# Patient Record
Sex: Female | Born: 1935 | Race: White | Hispanic: No | Smoking: Never smoker
Health system: Southern US, Community
[De-identification: ages and names within clinical notes are randomized; demographics above are authoritative.]

## PROBLEM LIST (undated history)

## (undated) DIAGNOSIS — M1990 Unspecified osteoarthritis, unspecified site: Secondary | ICD-10-CM

## (undated) DIAGNOSIS — I639 Cerebral infarction, unspecified: Secondary | ICD-10-CM

## (undated) DIAGNOSIS — K219 Gastro-esophageal reflux disease without esophagitis: Secondary | ICD-10-CM

## (undated) DIAGNOSIS — T4145XA Adverse effect of unspecified anesthetic, initial encounter: Secondary | ICD-10-CM

## (undated) DIAGNOSIS — I480 Paroxysmal atrial fibrillation: Secondary | ICD-10-CM

## (undated) DIAGNOSIS — R011 Cardiac murmur, unspecified: Secondary | ICD-10-CM

## (undated) DIAGNOSIS — I10 Essential (primary) hypertension: Secondary | ICD-10-CM

## (undated) DIAGNOSIS — C801 Malignant (primary) neoplasm, unspecified: Secondary | ICD-10-CM

## (undated) DIAGNOSIS — T8859XA Other complications of anesthesia, initial encounter: Secondary | ICD-10-CM

## (undated) HISTORY — PX: CHOLECYSTECTOMY: SHX55

## (undated) HISTORY — PX: OTHER SURGICAL HISTORY: SHX169

---

## 1998-10-02 ENCOUNTER — Other Ambulatory Visit: Admission: RE | Admit: 1998-10-02 | Discharge: 1998-10-02 | Payer: Self-pay | Admitting: *Deleted

## 2001-09-03 ENCOUNTER — Encounter: Payer: Self-pay | Admitting: Emergency Medicine

## 2001-09-03 ENCOUNTER — Emergency Department (HOSPITAL_COMMUNITY): Admission: EM | Admit: 2001-09-03 | Discharge: 2001-09-03 | Payer: Self-pay | Admitting: Emergency Medicine

## 2001-09-11 ENCOUNTER — Ambulatory Visit (HOSPITAL_COMMUNITY): Admission: RE | Admit: 2001-09-11 | Discharge: 2001-09-11 | Payer: Self-pay | Admitting: Family Medicine

## 2003-11-22 ENCOUNTER — Encounter: Admission: RE | Admit: 2003-11-22 | Discharge: 2003-11-22 | Payer: Self-pay | Admitting: Neurosurgery

## 2003-12-09 ENCOUNTER — Encounter: Admission: RE | Admit: 2003-12-09 | Discharge: 2003-12-09 | Payer: Self-pay | Admitting: Neurosurgery

## 2004-05-11 ENCOUNTER — Encounter: Admission: RE | Admit: 2004-05-11 | Discharge: 2004-05-11 | Payer: Self-pay | Admitting: Neurosurgery

## 2004-06-01 ENCOUNTER — Encounter: Admission: RE | Admit: 2004-06-01 | Discharge: 2004-06-01 | Payer: Self-pay | Admitting: Neurosurgery

## 2004-09-20 ENCOUNTER — Encounter: Admission: RE | Admit: 2004-09-20 | Discharge: 2004-09-20 | Payer: Self-pay | Admitting: Neurosurgery

## 2004-10-12 ENCOUNTER — Encounter (INDEPENDENT_AMBULATORY_CARE_PROVIDER_SITE_OTHER): Payer: Self-pay | Admitting: *Deleted

## 2004-10-12 ENCOUNTER — Encounter: Admission: RE | Admit: 2004-10-12 | Discharge: 2004-10-12 | Payer: Self-pay | Admitting: Family Medicine

## 2004-10-20 ENCOUNTER — Other Ambulatory Visit: Admission: RE | Admit: 2004-10-20 | Discharge: 2004-10-20 | Payer: Self-pay | Admitting: Family Medicine

## 2007-12-30 ENCOUNTER — Encounter: Admission: RE | Admit: 2007-12-30 | Discharge: 2007-12-30 | Payer: Self-pay | Admitting: Specialist

## 2008-04-21 ENCOUNTER — Encounter: Admission: RE | Admit: 2008-04-21 | Discharge: 2008-04-21 | Payer: Self-pay | Admitting: Specialist

## 2008-09-10 ENCOUNTER — Inpatient Hospital Stay (HOSPITAL_COMMUNITY): Admission: EM | Admit: 2008-09-10 | Discharge: 2008-09-12 | Payer: Self-pay | Admitting: Orthopedic Surgery

## 2008-11-08 ENCOUNTER — Other Ambulatory Visit: Admission: RE | Admit: 2008-11-08 | Discharge: 2008-11-08 | Payer: Self-pay | Admitting: Family Medicine

## 2009-01-12 ENCOUNTER — Ambulatory Visit: Payer: Self-pay | Admitting: Vascular Surgery

## 2010-09-02 ENCOUNTER — Encounter: Payer: Self-pay | Admitting: Neurosurgery

## 2010-11-27 LAB — CBC
HCT: 34.8 % — ABNORMAL LOW (ref 36.0–46.0)
HCT: 43.5 % (ref 36.0–46.0)
Hemoglobin: 11.7 g/dL — ABNORMAL LOW (ref 12.0–15.0)
Hemoglobin: 14.6 g/dL (ref 12.0–15.0)
MCV: 84.9 fL (ref 78.0–100.0)
RDW: 13.9 % (ref 11.5–15.5)
RDW: 13.9 % (ref 11.5–15.5)

## 2010-11-27 LAB — BASIC METABOLIC PANEL
BUN: 14 mg/dL (ref 6–23)
BUN: 16 mg/dL (ref 6–23)
CO2: 28 mEq/L (ref 19–32)
Calcium: 9.9 mg/dL (ref 8.4–10.5)
Chloride: 102 mEq/L (ref 96–112)
Chloride: 104 mEq/L (ref 96–112)
Creatinine, Ser: 0.79 mg/dL (ref 0.4–1.2)
GFR calc Af Amer: >60 mL/min (ref >60)
GFR calc Af Amer: >60 mL/min (ref >60)
GFR calc non Af Amer: >60 mL/min (ref >60)
Glucose, Bld: 113 mg/dL — ABNORMAL HIGH (ref 70–99)

## 2010-11-27 LAB — URINALYSIS, ROUTINE W REFLEX MICROSCOPIC
Bilirubin Urine: NEGATIVE
Hgb urine dipstick: NEGATIVE
Ketones, ur: NEGATIVE mg/dL
Protein, ur: NEGATIVE mg/dL
Urobilinogen, UA: 0.2 mg/dL (ref 0.0–1.0)

## 2010-11-27 LAB — DIFFERENTIAL
Lymphocytes Relative: 17 % (ref 12–46)
Lymphs Abs: 1.3 10*3/uL (ref 0.7–4.0)
Monocytes Absolute: 0.5 10*3/uL (ref 0.1–1.0)
Monocytes Relative: 6 % (ref 3–12)
Neutro Abs: 6.1 10*3/uL (ref 1.7–7.7)

## 2010-11-27 LAB — TYPE AND SCREEN: ABO/RH(D): O POS

## 2010-11-27 LAB — ABO/RH: ABO/RH(D): O POS

## 2010-12-26 NOTE — Op Note (Signed)
[name], [name]                 ACCOUNT NO.:  [id number]   MEDICAL RECORD NO.:  [id number]          PATIENT TYPE:  INP   LOCATION:  2899                         FACILITY:  [facility]   PHYSICIAN:  [name], M.D. DATE OF BIRTH:  10/02/1932   DATE OF PROCEDURE:  09/10/2008  DATE OF DISCHARGE:                               OPERATIVE REPORT   PREOPERATIVE DIAGNOSIS:  Left shoulder end-stage osteoarthritis.   POSTOPERATIVE DIAGNOSIS:  Left shoulder end-stage osteoarthritis.   PROCEDURE PERFORMED:  Left total shoulder arthroplasty using DePuy  Global system.   ATTENDING SURGEON:  Kristin Balls. Ranell Patrick, MD.   ASSISTANT:  [name]. [name]   ANESTHESIA:  General anesthesia was regional plus interscalene block.   ESTIMATED BLOOD LOSS:  Less than 50 mL.   FLUID REPLACEMENT:  2100 mL of crystalloid.   URINE OUTPUT:  600 mL.   INSTRUMENT COUNT:  Correct.   COMPLICATIONS:  There are no complications.   Perioperative antibiotics were given.   INDICATIONS:  The patient is a 77 year old female with disabling left  shoulder pain secondary to end-stage arthritis.  The patient has failed  conservative management consisting of injection therapy and  antiinflammatories, presents now for operative treatment on her shoulder  to restore function and eliminate pain.  Informed consent was obtained.   DESCRIPTION OF PROCEDURE:  After an adequate level of anesthesia was  achieved, the patient was positioned in a modified beach-chair position.  The left shoulder was sterilely prepped and draped in the usual manner.  The patient's preoperative range of motion showed excellent motion with  forward elevation of 280 degrees, abduction 120, external rotations 60,  internal rotation posterior belt line.  Following a sterile prep and  drape, we entered the shoulder using standard deltopectoral approach  starting coracoid process, incision made down to the anterior humeral  shaft.  Dissection down  through the subcutaneous tissues using Bovie,  identified the cephalic vein, took it laterally to the deltoid and  pectoralis was taken medially.  Conjoint tendon identified and retracted  that away.  Subscapularis was in good condition.  We performed a release  through subscapularis directly of the medial side of the biceps screw  and then we achieved that, going over towards rotator interval and just  to the base of coracoid process.  We freed up the subscapularis,  placed  #2 FiberWire sutures in modified Mason-Allen suture technique in the  free end of the tendon.  We next went ahead and released the soft tissue  of the proximal humerus progressively externally rotating until the  capsule is completely released off so we can deliver the shoulder  rotation.  We marked her neck cut using a neck cut guide and a T-handle  Crego elevator for retraction.  We then cut the head off using an  oscillating saw with appropriate version.  We basically had the  patient's elbow at the side and externally rotated the elbow about 10  degrees for resection.  At this point, I am happy with her resection,  removed excess osteophytes of proximal humerus, we reamed  up to a size  10 with a hand reamer trying to broach I could get to 8 down well to 10.  I could get down within about 2 mm or so of the osteotomy site, but was  not happy, was not being able to get it fully down.  I was afraid  because of the osteoporosis that if we get it any hard it may break the  humerus.  Thus, we decided to cement in an aid.  I took the aid body and  put it down in the prepared humerus and then looked at a coverage of a  40 head and this looked entirely appropriate with 40 x 15.  We next went  ahead and removed the head.  We then retracted the humerus posteriorly,  removed the labrum off the glenoid, scraped the cartilage off the  glenoid, this appeared to be size 40 glenoid marked to center point and  drilled out with the  centering drill bit.  We then reamed using a #40  reamer until we could get some bleeding bone, there were bunch of cysts  that were encountered.  We used small curettes to remove all cystic  material, which involved a large portion of the face of the glenoid.  It  is still felt to be contained lesions everywhere and this should be  amenable to cementing a keeled glenoid.  Unfortunately, we were worried  about the anchor peg being a little too big and getting near the edges  of the glenoid face.  So, we are going to keel glenoid and we drilled  out 12 o'clock and 6 o'clock holes.  Removed all excess soft tissue.  We  then went ahead and punched it with a keel punch and then placed a trial  in place, which looked like it had good coverage and rested down nicely  on the reamed glenoid surface.  We then reduced the shoulder with a 40 x  15 head and this looked appropriate with a soft tissue balancing,  removed the trial components, cemented the real 8 stem in after we  placed #2 FiberWires into the lesser tuberosity which cemented the 8  stem in place.  While the cement hardened, we removed the excess cement  with quarter-inch curved osteotome and then retracted the humerus  posteriorly.  We placed Gelfoam-soaked thrombin in the glenoid and then  dried that nicely and then using a Tumi syringe, injected the cement  into the glenoid and then cemented the keeled glenoid in place, this is  a DePuy all-poly keeled glenoid.  Once that was allowed to harden, we  removed excess cement with quarter-inch curved osteotome, trialed with  an 18 first and the 18 actually gave [place] good soft tissue balancing, able  to translate 50% posteriorly and 30% inferiorly.  We then selected the  real 40 x 18 humeral head and then packed that on paper, checked for any  debris before reducing the shoulder.  We were happy with the stability  and then repaired the subscapularis seen anatomically using figure-of-  eight  sutures into the rotator interval with #2 FiberWire using the  shaft sutures and the Mason-Allen sutures to secure the subscapularis  anatomically.  We were happy with that repair and able to externally  rotate 60 degrees following the repair with the nice stable shoulder.  We thoroughly irrigated and closed the deltopectoral wound with 0 Vicryl  suture followed by 2-0 Vicryl subcutaneous closure and 4-0 Monocryl for  skin.  Steri-Strips applied followed by a sterile dressing.  The patient  tolerated the surgery well.      [name], M.D.  Electronically Signed     SRN/MEDQ  D:  09/10/2008  T:  09/11/2008  Job:  [id number]

## 2010-12-26 NOTE — Consult Note (Signed)
NEW PATIENT CONSULTATION   [name], [name]  DOB:  1933/08/01                                       01/12/2009  CHART#:[id number]   The patient presents today for evaluation of lower extremity discomfort.  She is a very pleasant 77 year old white female who reports pain,  burning and stinging sensation in both legs.  She reports this is  somewhat worse on her left leg than right and reports this is mainly  from her knees distally.  She reports, however, this can occur any time  and can occur at night with rest as well.  She has mild swelling in her  ankles and relates this to trivial trauma in her left ankle many years  ago.  She denies any prior history of deep venous thrombosis.   She does have history hypertension, osteoarthritis, actinic keratosis  and chronic low back pain.  She has had recent shoulder replacement in  January, 2010.  She does elevate her legs when possible and feels this  does make them feel somewhat better.  She does wear nonprescription  support hose and feels that she does get some relief with this as well.  She has tried prescription grade compression garments in the past and  reports these are difficult to wear as she feels they are  binding her  legs.   PHYSICAL EXAMINATION:  A well-developed, well-nourished white female  appearing stated age of 60.  Blood pressure 142/92, pulse 74,  respirations 18.  Her radial pulses are 2+.  She has 2+ dorsalis pedis  pulses.  She does not have any evidence of varicose veins and does not  have any swelling.  She has had multiple bilateral scattered  telangiectasia.   She underwent screening venous duplex by myself and this shows normal  size saphenous veins bilaterally with no evidence of valvular  incompetence.   I discussed this at length with the patient.  I explained that she does  not appear to have any evidence of any serious significant for reflux or  other venous pathology aside from  spider vein telangiectasia.  I have  encouraged her to continue her nonprescription grade support hose for  comfort.  I would not recommend prescription grade since that this be  difficult to her wear with her shoulder replacement and I do not feel  that she has any pathology that would warrant this degree of  compression.  She was reassured with this discussion and will see [place] on  an as-needed basis.   Larina Earthly, M.D.  Electronically Signed   TFE/MEDQ  D:  01/12/2009  T:  01/13/2009  Job:  [id number]   cc:   Katrina Stack, FNP

## 2010-12-29 NOTE — H&P (Signed)
NAMESACHE, SANE                 ACCOUNT NO.:  [id number]   MEDICAL RECORD NO.:  [id number]           PATIENT TYPE:   LOCATION:                                 FACILITY:   PHYSICIAN:  Almedia Balls. Ranell Patrick, M.D. DATE OF BIRTH:  [birth year]   DATE OF ADMISSION:  DATE OF DISCHARGE:                              HISTORY & PHYSICAL   CHIEF COMPLAINT:  Left shoulder pain.   HISTORY OF PRESENT ILLNESS:  The patient is a 77 year old female with  worsening left shoulder pain, it has been refractory to conservative  treatment.  The patient elected to have left total shoulder arthroplasty  by Dr. Malon Kindle.   PAST MEDICAL HISTORY:  Hypertension.   FAMILY MEDICAL HISTORY:  Cancer and CVA.   SOCIAL HISTORY:  The patient of Dr.  Foy Guadal[place], does not smoke or use  alcohol, and is retired.   DRUG ALLERGIES:  None.   CURRENT MEDICATIONS:  1. Mobic 50 mg p.o. daily.  2. Omeprazole 1 tablet daily.  3. Hyzaar 100/25 daily.  4. Ambien 10 mg p.o. daily.  5. Vitamins and calcium.   REVIEW OF SYSTEMS:  Pain with range of motion.   PHYSICAL EXAMINATION:  VITAL SIGNS:  Pulse 80, respirations 16, and  blood pressure 146/88.  GENERAL:  The patient is a healthy-appearing 77 year old female in no  acute distress.  Pleasant mood and affect.  Oriented x3.  HEAD AND NECK:  Full range of motion without any difficulty.  Cranial  nerves II through XII grossly intact.  CHEST:  Active breath sounds bilaterally with no wheeze, rhonchi, or  rales.  ABDOMEN:  Nontender, nondistended with active bowel sounds.  EXTREMITIES:  Moderate tenderness over the left shoulder, forward  flexion 80 degrees, external rotation 10 degrees, internal rotation  __________.  Capillary refill less than 2 seconds.  She has no rashes or  edema.  X-rays showing end-stage osteoarthritis of the left shoulder.   IMPRESSION:  End-stage osteoarthritis, left shoulder.   PLAN OF ACTION:  A left total shoulder arthroplasty by Dr. Malon Kindle.      Thomas B. Durwin Nora, P.A.      Almedia Balls. Ranell Patrick, M.D.  Electronically Signed    TBD/MEDQ  D:  08/24/2008  T:  08/25/2008  Job:  [id number]

## 2010-12-29 NOTE — Discharge Summary (Signed)
[name], [name]                 ACCOUNT NO.:  [id number]   MEDICAL RECORD NO.:  [id number]          PATIENT TYPE:  INP   LOCATION:  5018                         FACILITY:  [facility]   PHYSICIAN:  Almedia Balls. Ranell Patrick, M.D. DATE OF BIRTH:  07/17/1933   DATE OF ADMISSION:  09/10/2008  DATE OF DISCHARGE:  09/12/2008                               DISCHARGE SUMMARY   ADMISSION DIAGNOSIS:  Left end-stage shoulder osteoarthritis.   DISCHARGE DIAGNOSIS:  Left end-stage shoulder osteoarthritis status post  total shoulder arthroplasty.   BRIEF HISTORY:  The patient is a 77 year old female with worsening left  shoulder pain secondary to osteoarthritis.  The patient has elected to  have a total shoulder replacement.   PROCEDURE:  The patient had left total shoulder arthroplasty by Dr.  Malon Kindle on February 08, 2009.   SURGEON:  Almedia Balls. Ranell Patrick, MD   ASSISTANT:  Donnie Coffin. Dixon, PA   General anesthesia with interscalene block were used.   No complications.   HOSPITAL COURSE:  The patient was admitted on September 10, 2008, for the  above-stated procedure which she tolerated well.  After adequate time in  postanesthesia care unit, she was transferred up to 5000.  On postop day  1, the patient complained about minimal pain to that shoulder.  She was  able to work with physical therapy quite well that day.  Denied any  other symptoms.  No chills and otherwise is doing quite well.  On postop  day 2, she was discharged from the hospital secondary to doing so well  with therapy.  Sling was in place and dressing was intact, and the labs  were within acceptable limits.   DISCHARGE MEDICATIONS:  1. Mobic 15 mg daily.  2. Omeprazole 1 tablet daily.  3. Hyzaar 100/25 daily.  4. Ambien 10 mg p.o. nightly.  5. Percocet 5/325 one to two tabs q.4-6 hours p.r.n. pain.  6. Robaxin 500 mg p.o. q.6 hours.   FOLLOWUP:  Followup back with Dr. Malon Kindle in 2 weeks.  Her  condition is stable.  Her diet  is regular.  The patient has no known  drug allergies.      Thomas B. Durwin Nora, P.A.      Almedia Balls. Ranell Patrick, M.D.  Electronically Signed    TBD/MEDQ  D:  10/22/2008  T:  10/23/2008  Job:  [id number]

## 2012-01-10 ENCOUNTER — Encounter (HOSPITAL_COMMUNITY): Payer: Self-pay | Admitting: Pharmacist

## 2012-01-15 ENCOUNTER — Encounter (HOSPITAL_COMMUNITY)
Admission: RE | Admit: 2012-01-15 | Discharge: 2012-01-15 | Disposition: A | Discharge status: Home / Self Care | Payer: Medicare Other | Source: Ambulatory Visit | Attending: Orthopedic Surgery | Admitting: Orthopedic Surgery

## 2012-01-15 ENCOUNTER — Ambulatory Visit (HOSPITAL_COMMUNITY)
Admission: RE | Admit: 2012-01-15 | Discharge: 2012-01-15 | Disposition: A | Discharge status: Home / Self Care | Payer: Medicare Other | Source: Ambulatory Visit | Attending: Orthopedic Surgery | Admitting: Orthopedic Surgery

## 2012-01-15 ENCOUNTER — Encounter (HOSPITAL_COMMUNITY): Payer: Self-pay

## 2012-01-15 DIAGNOSIS — Z01818 Encounter for other preprocedural examination: Secondary | ICD-10-CM | POA: Insufficient documentation

## 2012-01-15 DIAGNOSIS — Z01810 Encounter for preprocedural cardiovascular examination: Secondary | ICD-10-CM | POA: Insufficient documentation

## 2012-01-15 DIAGNOSIS — Z01812 Encounter for preprocedural laboratory examination: Secondary | ICD-10-CM | POA: Insufficient documentation

## 2012-01-15 DIAGNOSIS — I498 Other specified cardiac arrhythmias: Secondary | ICD-10-CM | POA: Insufficient documentation

## 2012-01-15 HISTORY — DX: Cardiac murmur, unspecified: R01.1

## 2012-01-15 HISTORY — DX: Essential (primary) hypertension: I10

## 2012-01-15 HISTORY — DX: Unspecified osteoarthritis, unspecified site: M19.90

## 2012-01-15 HISTORY — DX: Gastro-esophageal reflux disease without esophagitis: K21.9

## 2012-01-15 LAB — SURGICAL PCR SCREEN: Staphylococcus aureus: NEGATIVE

## 2012-01-15 LAB — URINALYSIS, ROUTINE W REFLEX MICROSCOPIC
Bilirubin Urine: NEGATIVE
Nitrite: NEGATIVE
Specific Gravity, Urine: 1.022 (ref 1.005–1.030)
Urobilinogen, UA: 0.2 mg/dL (ref 0.0–1.0)
pH: 7 (ref 5.0–8.0)

## 2012-01-15 LAB — DIFFERENTIAL
Basophils Absolute: 0 10*3/uL (ref 0.0–0.1)
Basophils Relative: 0 % (ref 0–1)
Eosinophils Relative: 2 % (ref 0–5)
Monocytes Absolute: 0.7 10*3/uL (ref 0.1–1.0)
Neutro Abs: 5.9 10*3/uL (ref 1.7–7.7)

## 2012-01-15 LAB — CBC
HCT: 40 % (ref 36.0–46.0)
MCHC: 32.5 g/dL (ref 30.0–36.0)
MCV: 86.6 fL (ref 78.0–100.0)
Platelets: 218 10*3/uL (ref 150–400)
RDW: 13.8 % (ref 11.5–15.5)
WBC: 8.5 10*3/uL (ref 4.0–10.5)

## 2012-01-15 LAB — BASIC METABOLIC PANEL
BUN: 22 mg/dL (ref 6–23)
Calcium: 10.6 mg/dL — ABNORMAL HIGH (ref 8.4–10.5)
Chloride: 100 mEq/L (ref 96–112)
Creatinine, Ser: 0.76 mg/dL (ref 0.50–1.10)
GFR calc Af Amer: >90 mL/min (ref >90)

## 2012-01-15 LAB — APTT: aPTT: 32 seconds (ref 24–37)

## 2012-01-15 LAB — URINE MICROSCOPIC-ADD ON

## 2012-01-15 LAB — PROTIME-INR: Prothrombin Time: 13 seconds (ref 11.6–15.2)

## 2012-01-15 MED ORDER — CEFAZOLIN SODIUM 1-5 GM-% IV SOLN: 1.0000 g | INTRAVENOUS | Status: DC

## 2012-01-15 MED ORDER — CHLORHEXIDINE GLUCONATE 4 % EX LIQD: 60.0000 mL | Freq: Once | CUTANEOUS | Status: DC

## 2012-01-15 NOTE — Patient Instructions (Addendum)
20 [name]  01/15/2012   Your procedure is scheduled on:  01-21-2012  Report to [facility] at   [phone].  Call this number if you have problems the morning of surgery: [phone]   Remember:   Do not eat food or drink liquids:After Midnight.  .  Take these medicines the morning of surgery with A SIP OF WATER: omeprazole, cymbalta   Do not wear jewelry or make up.  Do not wear lotions, powders, or perfumes.Do not wear deodorant.    Do not bring valuables to the hospital.  Contacts, dentures or bridgework may not be worn into surgery.  Leave suitcase in the car. After surgery it may be brought to your room.  For patients admitted to the hospital, checkout time is 11:00 AM the day of discharge.     Special Instructions: CHG Shower Use Special Wash: 1/2 bottle night before surgery and 1/2 bottle morning of surgery.neck down avoid private area   Please read over the following fact sheets that you were given: MRSA Information, blood fact sheet, incentive spirometer fact sheet  [facility] pre op nurse phone number [phone], call if needed

## 2012-01-15 NOTE — Pre-Procedure Instructions (Signed)
bmet results faxed to dr Sherlynn Carbon, fax confirmation received and placed on chart

## 2012-01-16 NOTE — H&P (Signed)
[name] is an 78 y.o. female.    Chief Complaint: left knee OA and pain   HPI: Pt is a 78 y.o. female complaining of left knee pain for 3+ years. Pain had continually increased since the beginning. X-rays in the clinic show end-stage arthritic changes of the left knee. Pt has tried various conservative treatments which have failed to alleviate their symptoms, including NSAIDs and steroid injections. Various options are discussed with the patient. Risks, benefits and expectations were discussed with the patient. Patient understand the risks, benefits and expectations and wishes to proceed with surgery.   PCP:  Lenora Boys, MD, MD  D/C Plans:  Rehab - would prefer [facility]  Post-op Meds:  No Rx given  Tranexamic Acid:   To be given  Decadron:   To be given  PMH: Past Medical History  Diagnosis Date  . Hypertension   . Heart murmur - mild, no cardiologist   . GERD (gastroesophageal reflux disease)   . Arthritis     PSH: Past Surgical History  Procedure Date  . Pre skin cancer areas removed from left middle finger and right  hand and right leg yrs ago  . Cesarean section 1957  . Cholecystectomy 1963 or 1964  . Tear duct surgery both eyes 18-20 yrs ago  . Left shoulder replacement 3-4- yrs ago  . Both eyes lens replacements for cataracts dec 2012     Social History:  reports that she has never smoked. She has never used smokeless tobacco. She reports that she does not drink alcohol or use illicit drugs.  Allergies:  No Known Allergies  Medications: Current Outpatient Prescriptions  Medication Sig Dispense Refill  . calcium-vitamin D (OSCAL WITH D) 500-200 MG-UNIT per tablet Take 1 tablet by mouth daily.      . DULoxetine (CYMBALTA) 60 MG capsule Take 60 mg by mouth 2 (two) times daily.      Marland Kitchen losartan-hydrochlorothiazide (HYZAAR) 100-25 MG per tablet Take 1 tablet by mouth daily.      . meloxicam (MOBIC) 15 MG tablet Take 15 mg by mouth daily.      .  Multiple Vitamin (MULITIVITAMIN WITH MINERALS) TABS Take 1 tablet by mouth daily.      Marland Kitchen omeprazole (PRILOSEC) 20 MG capsule Take 20 mg by mouth every morning.       . Vitamin D, Ergocalciferol, (DRISDOL) 50000 UNITS CAPS Take 50,000 Units by mouth every 7 (seven) days. Takes on Tuesdays.      Marland Kitchen zolpidem (AMBIEN) 10 MG tablet Take 10 mg by mouth at bedtime as needed. For insomnia.        ROS: Review of Systems  Constitutional: Negative.   HENT: Negative.   Eyes: Negative.   Respiratory: Negative.   Cardiovascular: Negative.   Gastrointestinal: Negative.   Genitourinary: Positive for frequency.  Musculoskeletal: Positive for myalgias, back pain and joint pain.  Skin: Negative.   Neurological: Negative.   Endo/Heme/Allergies: Negative.   Psychiatric/Behavioral: Negative.      Physical Exam: There were no vitals taken for this visit. Physical Exam  Constitutional: She is oriented to person, place, and time and well-developed, well-nourished, and in no distress.  HENT:  Head: Normocephalic and atraumatic.  Nose: Nose normal.  Mouth/Throat: Oropharynx is clear and moist. She has dentures.  Eyes: Pupils are equal, round, and reactive to light.  Neck: Neck supple. No JVD present. No tracheal deviation present. No thyromegaly present.  Cardiovascular: Normal rate, regular rhythm and intact  distal pulses.   Murmur heard. Pulmonary/Chest: Effort normal and breath sounds normal. No respiratory distress. She exhibits no tenderness.  Abdominal: Soft. There is no tenderness. There is no guarding.  Musculoskeletal:       Left knee: She exhibits decreased range of motion, swelling and bony tenderness. She exhibits no effusion, no ecchymosis, no laceration and no erythema. tenderness found.  Lymphadenopathy:    She has no cervical adenopathy.  Neurological: She is alert and oriented to person, place, and time.  Skin: Skin is warm and dry.  Psychiatric: Affect normal.       Assessment/Plan Assessment: left knee OA and pain   Plan: Patient will undergo a left total knee arthroplasty on 01/21/2012 per Dr. Charlann Boxer at [facility]. Risks benefits and expectation were discussed with the patient. Patient understand risks, benefits and expectation and wishes to proceed.   Anastasio Auerbach Teyonna Plaisted   PAC  01/16/2012, 3:20 PM

## 2012-01-17 NOTE — Pre-Procedure Instructions (Signed)
Showed dr Acey Lav 01-15-2012 ekg and 09-03-2001 ekg and 09-10-2008 ekg results, made aware of patient medical history, pt ok for surgery.

## 2012-01-21 ENCOUNTER — Encounter (HOSPITAL_COMMUNITY): Payer: Self-pay | Admitting: Anesthesiology

## 2012-01-21 ENCOUNTER — Encounter (HOSPITAL_COMMUNITY): Payer: Self-pay | Admitting: Orthopedic Surgery

## 2012-01-21 ENCOUNTER — Ambulatory Visit (HOSPITAL_COMMUNITY): Payer: Medicare Other | Admitting: Anesthesiology

## 2012-01-21 ENCOUNTER — Inpatient Hospital Stay (HOSPITAL_COMMUNITY)
Admission: RE | Admit: 2012-01-21 | Discharge: 2012-01-24 | DRG: 470 | Disposition: A | Discharge status: Skilled Nursing Facility | Payer: Medicare Other | Source: Ambulatory Visit | Attending: Orthopedic Surgery | Admitting: Orthopedic Surgery

## 2012-01-21 ENCOUNTER — Encounter (HOSPITAL_COMMUNITY)
Admission: RE | Disposition: A | Discharge status: Skilled Nursing Facility | Payer: Self-pay | Source: Ambulatory Visit | Attending: Orthopedic Surgery

## 2012-01-21 ENCOUNTER — Encounter (HOSPITAL_COMMUNITY): Payer: Self-pay | Admitting: *Deleted

## 2012-01-21 DIAGNOSIS — Z96659 Presence of unspecified artificial knee joint: Secondary | ICD-10-CM

## 2012-01-21 DIAGNOSIS — K219 Gastro-esophageal reflux disease without esophagitis: Secondary | ICD-10-CM | POA: Diagnosis present

## 2012-01-21 DIAGNOSIS — I10 Essential (primary) hypertension: Secondary | ICD-10-CM | POA: Diagnosis present

## 2012-01-21 DIAGNOSIS — M1710 Unilateral primary osteoarthritis, unspecified knee: Principal | ICD-10-CM | POA: Diagnosis present

## 2012-01-21 HISTORY — PX: TOTAL KNEE ARTHROPLASTY: SHX125

## 2012-01-21 SURGERY — ARTHROPLASTY, KNEE, TOTAL
Anesthesia: Spinal | Site: Knee | Laterality: Left | Wound class: Clean

## 2012-01-21 MED ORDER — DOCUSATE SODIUM 100 MG PO CAPS
100.0000 mg | ORAL_CAPSULE | Freq: Two times a day (BID) | ORAL | Status: DC
  Administered 2012-01-21 – 2012-01-23 (×5): 100 mg via ORAL

## 2012-01-21 MED ORDER — DEXAMETHASONE SODIUM PHOSPHATE 10 MG/ML IJ SOLN
INTRAMUSCULAR | Status: DC | PRN
  Administered 2012-01-21: 10 mg via INTRAVENOUS

## 2012-01-21 MED ORDER — ACETAMINOPHEN 10 MG/ML IV SOLN
INTRAVENOUS | Status: DC | PRN
  Administered 2012-01-21: 1000 mg via INTRAVENOUS

## 2012-01-21 MED ORDER — ONDANSETRON HCL 4 MG/2ML IJ SOLN
INTRAMUSCULAR | Status: DC | PRN
  Administered 2012-01-21: 4 mg via INTRAVENOUS

## 2012-01-21 MED ORDER — DIPHENHYDRAMINE HCL 25 MG PO CAPS: 25.0000 mg | ORAL_CAPSULE | Freq: Four times a day (QID) | ORAL | Status: DC | PRN

## 2012-01-21 MED ORDER — HYDROMORPHONE HCL PF 1 MG/ML IJ SOLN: 0.2500 mg | INTRAMUSCULAR | Status: DC | PRN

## 2012-01-21 MED ORDER — PROPOFOL 10 MG/ML IV EMUL
INTRAVENOUS | Status: DC | PRN
  Administered 2012-01-21: 300 ug/kg/min via INTRAVENOUS

## 2012-01-21 MED ORDER — ZOLPIDEM TARTRATE 5 MG PO TABS
5.0000 mg | ORAL_TABLET | Freq: Every evening | ORAL | Status: DC | PRN
  Administered 2012-01-21 – 2012-01-23 (×3): 5 mg via ORAL
  Filled 2012-01-21 (×3): qty 1

## 2012-01-21 MED ORDER — METHOCARBAMOL 100 MG/ML IJ SOLN
500.0000 mg | Freq: Four times a day (QID) | INTRAVENOUS | Status: DC | PRN
  Administered 2012-01-21: 500 mg via INTRAVENOUS
  Filled 2012-01-21 (×2): qty 5

## 2012-01-21 MED ORDER — LIDOCAINE HCL (CARDIAC) 20 MG/ML IV SOLN
INTRAVENOUS | Status: DC | PRN
  Administered 2012-01-21: 50 mg via INTRAVENOUS

## 2012-01-21 MED ORDER — HYDROCHLOROTHIAZIDE 25 MG PO TABS
25.0000 mg | ORAL_TABLET | Freq: Every day | ORAL | Status: DC
  Administered 2012-01-21 – 2012-01-24 (×4): 25 mg via ORAL
  Filled 2012-01-21 (×4): qty 1

## 2012-01-21 MED ORDER — POLYETHYLENE GLYCOL 3350 17 G PO PACK
17.0000 g | PACK | Freq: Two times a day (BID) | ORAL | Status: DC
  Administered 2012-01-21 – 2012-01-23 (×5): 17 g via ORAL

## 2012-01-21 MED ORDER — PHENYLEPHRINE HCL 10 MG/ML IJ SOLN
INTRAMUSCULAR | Status: DC | PRN
  Administered 2012-01-21: 80 ug via INTRAVENOUS
  Administered 2012-01-21 (×2): 40 ug via INTRAVENOUS

## 2012-01-21 MED ORDER — 0.9 % SODIUM CHLORIDE (POUR BTL) OPTIME
TOPICAL | Status: DC | PRN
  Administered 2012-01-21: 1000 mL

## 2012-01-21 MED ORDER — LACTATED RINGERS IV SOLN
INTRAVENOUS | Status: DC | PRN
  Administered 2012-01-21 (×3): via INTRAVENOUS

## 2012-01-21 MED ORDER — TRANEXAMIC ACID 100 MG/ML IV SOLN
1300.0000 mg | Freq: Once | INTRAVENOUS | Status: AC
  Administered 2012-01-21: 1300 mg via INTRAVENOUS
  Filled 2012-01-21: qty 13

## 2012-01-21 MED ORDER — FENTANYL CITRATE 0.05 MG/ML IJ SOLN
INTRAMUSCULAR | Status: DC | PRN
  Administered 2012-01-21: 100 ug via INTRAVENOUS

## 2012-01-21 MED ORDER — MEPERIDINE HCL 50 MG/ML IJ SOLN: 6.2500 mg | INTRAMUSCULAR | Status: DC | PRN

## 2012-01-21 MED ORDER — KETOROLAC TROMETHAMINE 30 MG/ML IJ SOLN
INTRAMUSCULAR | Status: AC
  Filled 2012-01-21: qty 1

## 2012-01-21 MED ORDER — ONDANSETRON HCL 4 MG PO TABS: 4.0000 mg | ORAL_TABLET | Freq: Four times a day (QID) | ORAL | Status: DC | PRN

## 2012-01-21 MED ORDER — METHOCARBAMOL 500 MG PO TABS
500.0000 mg | ORAL_TABLET | Freq: Four times a day (QID) | ORAL | Status: DC | PRN
  Administered 2012-01-21 – 2012-01-23 (×5): 500 mg via ORAL
  Filled 2012-01-21 (×5): qty 1

## 2012-01-21 MED ORDER — PANTOPRAZOLE SODIUM 40 MG PO TBEC
40.0000 mg | DELAYED_RELEASE_TABLET | Freq: Every day | ORAL | Status: DC
  Administered 2012-01-22 – 2012-01-23 (×2): 40 mg via ORAL
  Filled 2012-01-21 (×3): qty 1

## 2012-01-21 MED ORDER — CEFAZOLIN SODIUM-DEXTROSE 2-3 GM-% IV SOLR
2.0000 g | Freq: Once | INTRAVENOUS | Status: AC
  Administered 2012-01-21: 2 g via INTRAVENOUS

## 2012-01-21 MED ORDER — KETAMINE HCL 10 MG/ML IJ SOLN
INTRAMUSCULAR | Status: DC | PRN
  Administered 2012-01-21: 5 mg via INTRAVENOUS
  Administered 2012-01-21: 20 mg via INTRAVENOUS
  Administered 2012-01-21 (×5): 5 mg via INTRAVENOUS

## 2012-01-21 MED ORDER — ACETAMINOPHEN 10 MG/ML IV SOLN
INTRAVENOUS | Status: AC
  Filled 2012-01-21: qty 100

## 2012-01-21 MED ORDER — METOCLOPRAMIDE HCL 10 MG PO TABS: 5.0000 mg | ORAL_TABLET | Freq: Three times a day (TID) | ORAL | Status: DC | PRN

## 2012-01-21 MED ORDER — RIVAROXABAN 10 MG PO TABS
10.0000 mg | ORAL_TABLET | ORAL | Status: DC
  Administered 2012-01-22 – 2012-01-24 (×3): 10 mg via ORAL
  Filled 2012-01-21 (×5): qty 1

## 2012-01-21 MED ORDER — METOCLOPRAMIDE HCL 5 MG/ML IJ SOLN: 5.0000 mg | Freq: Three times a day (TID) | INTRAMUSCULAR | Status: DC | PRN

## 2012-01-21 MED ORDER — PROMETHAZINE HCL 25 MG/ML IJ SOLN: 6.2500 mg | INTRAMUSCULAR | Status: DC | PRN

## 2012-01-21 MED ORDER — DULOXETINE HCL 60 MG PO CPEP
60.0000 mg | ORAL_CAPSULE | Freq: Two times a day (BID) | ORAL | Status: DC
  Administered 2012-01-21 – 2012-01-24 (×6): 60 mg via ORAL
  Filled 2012-01-21 (×7): qty 1

## 2012-01-21 MED ORDER — HYDROMORPHONE HCL PF 1 MG/ML IJ SOLN: 0.5000 mg | INTRAMUSCULAR | Status: DC | PRN

## 2012-01-21 MED ORDER — BUPIVACAINE IN DEXTROSE 0.75-8.25 % IT SOLN
INTRATHECAL | Status: DC | PRN
  Administered 2012-01-21: 1.8 mL via INTRATHECAL

## 2012-01-21 MED ORDER — LACTATED RINGERS IV SOLN: INTRAVENOUS | Status: DC

## 2012-01-21 MED ORDER — ONDANSETRON HCL 4 MG/2ML IJ SOLN: 4.0000 mg | Freq: Four times a day (QID) | INTRAMUSCULAR | Status: DC | PRN

## 2012-01-21 MED ORDER — CEFAZOLIN SODIUM-DEXTROSE 2-3 GM-% IV SOLR
2.0000 g | Freq: Four times a day (QID) | INTRAVENOUS | Status: AC
  Administered 2012-01-21 – 2012-01-22 (×2): 2 g via INTRAVENOUS
  Filled 2012-01-21 (×2): qty 50

## 2012-01-21 MED ORDER — ALUM & MAG HYDROXIDE-SIMETH 200-200-20 MG/5ML PO SUSP
30.0000 mL | ORAL | Status: DC | PRN
  Filled 2012-01-21: qty 30

## 2012-01-21 MED ORDER — CEFAZOLIN SODIUM-DEXTROSE 2-3 GM-% IV SOLR
INTRAVENOUS | Status: AC
  Filled 2012-01-21: qty 50

## 2012-01-21 MED ORDER — BISACODYL 5 MG PO TBEC: 5.0000 mg | DELAYED_RELEASE_TABLET | Freq: Every day | ORAL | Status: DC | PRN

## 2012-01-21 MED ORDER — LOSARTAN POTASSIUM-HCTZ 100-25 MG PO TABS: 1.0000 | ORAL_TABLET | Freq: Every day | ORAL | Status: DC

## 2012-01-21 MED ORDER — LOSARTAN POTASSIUM 50 MG PO TABS
100.0000 mg | ORAL_TABLET | Freq: Every day | ORAL | Status: DC
  Administered 2012-01-21 – 2012-01-24 (×4): 100 mg via ORAL
  Filled 2012-01-21 (×4): qty 2

## 2012-01-21 MED ORDER — PHENOL 1.4 % MT LIQD: 1.0000 | OROMUCOSAL | Status: DC | PRN

## 2012-01-21 MED ORDER — MENTHOL 3 MG MT LOZG
1.0000 | LOZENGE | OROMUCOSAL | Status: DC | PRN
  Filled 2012-01-21: qty 9

## 2012-01-21 MED ORDER — TRANEXAMIC ACID 100 MG/ML IV SOLN: 15.0000 mg/kg | Freq: Once | INTRAVENOUS | Status: DC

## 2012-01-21 MED ORDER — SODIUM CHLORIDE 0.9 % IV SOLN
INTRAVENOUS | Status: DC
  Administered 2012-01-21 – 2012-01-22 (×2): via INTRAVENOUS
  Filled 2012-01-21 (×8): qty 1000

## 2012-01-21 MED ORDER — BUPIVACAINE-EPINEPHRINE 0.25% -1:200000 IJ SOLN
INTRAMUSCULAR | Status: AC
  Filled 2012-01-21: qty 1

## 2012-01-21 MED ORDER — LACTATED RINGERS IV SOLN
INTRAVENOUS | Status: DC
  Administered 2012-01-21: 1000 mL via INTRAVENOUS

## 2012-01-21 MED ORDER — HYDROCODONE-ACETAMINOPHEN 7.5-325 MG PO TABS
1.0000 | ORAL_TABLET | ORAL | Status: DC
  Administered 2012-01-21 – 2012-01-23 (×10): 2 via ORAL
  Administered 2012-01-23 (×2): 1 via ORAL
  Administered 2012-01-23 – 2012-01-24 (×6): 2 via ORAL
  Filled 2012-01-21 (×9): qty 2
  Filled 2012-01-21: qty 1
  Filled 2012-01-21 (×3): qty 2
  Filled 2012-01-21: qty 1
  Filled 2012-01-21 (×4): qty 2

## 2012-01-21 MED ORDER — BUPIVACAINE-EPINEPHRINE PF 0.25-1:200000 % IJ SOLN
INTRAMUSCULAR | Status: DC | PRN
  Administered 2012-01-21: 50 mL

## 2012-01-21 MED ORDER — KETOROLAC TROMETHAMINE 30 MG/ML IJ SOLN
INTRAMUSCULAR | Status: DC | PRN
  Administered 2012-01-21: 30 mg

## 2012-01-21 MED ORDER — FERROUS SULFATE 325 (65 FE) MG PO TABS
325.0000 mg | ORAL_TABLET | Freq: Three times a day (TID) | ORAL | Status: DC
  Administered 2012-01-21 – 2012-01-24 (×8): 325 mg via ORAL
  Filled 2012-01-21 (×11): qty 1

## 2012-01-21 MED ORDER — FLEET ENEMA 7-19 GM/118ML RE ENEM: 1.0000 | ENEMA | Freq: Once | RECTAL | Status: AC | PRN

## 2012-01-21 MED ORDER — SUCCINYLCHOLINE CHLORIDE 20 MG/ML IJ SOLN
INTRAMUSCULAR | Status: DC | PRN
  Administered 2012-01-21: 100 mg via INTRAVENOUS

## 2012-01-21 MED ORDER — CELECOXIB 200 MG PO CAPS
200.0000 mg | ORAL_CAPSULE | Freq: Two times a day (BID) | ORAL | Status: DC
  Administered 2012-01-21 – 2012-01-24 (×6): 200 mg via ORAL
  Filled 2012-01-21 (×7): qty 1

## 2012-01-21 SURGICAL SUPPLY — 58 items
ADH SKN CLS APL DERMABOND .7 (GAUZE/BANDAGES/DRESSINGS) ×1
BAG SPEC THK2 15X12 ZIP CLS (MISCELLANEOUS) ×1
BAG ZIPLOCK 12X15 (MISCELLANEOUS) ×2 IMPLANT
BANDAGE ELASTIC 6 VELCRO ST LF (GAUZE/BANDAGES/DRESSINGS) ×2 IMPLANT
BANDAGE ESMARK 6X9 LF (GAUZE/BANDAGES/DRESSINGS) ×1 IMPLANT
BLADE SAW SGTL 13.0X1.19X90.0M (BLADE) ×2 IMPLANT
BNDG CMPR 9X6 STRL LF SNTH (GAUZE/BANDAGES/DRESSINGS) ×1
BNDG ESMARK 6X9 LF (GAUZE/BANDAGES/DRESSINGS) ×2
BOWL SMART MIX CTS (DISPOSABLE) ×2 IMPLANT
CEMENT HV SMART SET (Cement) ×2 IMPLANT
CLOTH BEACON ORANGE TIMEOUT ST (SAFETY) ×2 IMPLANT
CUFF TOURN SGL QUICK 34 (TOURNIQUET CUFF) ×2
CUFF TRNQT CYL 34X4X40X1 (TOURNIQUET CUFF) ×1 IMPLANT
DECANTER SPIKE VIAL GLASS SM (MISCELLANEOUS) ×2 IMPLANT
DERMABOND ADVANCED (GAUZE/BANDAGES/DRESSINGS) ×1
DERMABOND ADVANCED .7 DNX12 (GAUZE/BANDAGES/DRESSINGS) ×1 IMPLANT
DRAPE EXTREMITY T 121X128X90 (DRAPE) ×2 IMPLANT
DRAPE POUCH INSTRU U-SHP 10X18 (DRAPES) ×2 IMPLANT
DRAPE U-SHAPE 47X51 STRL (DRAPES) ×2 IMPLANT
DRSG AQUACEL AG ADV 3.5X10 (GAUZE/BANDAGES/DRESSINGS) ×2 IMPLANT
DRSG TEGADERM 4X4.75 (GAUZE/BANDAGES/DRESSINGS) ×2 IMPLANT
DURAPREP 26ML APPLICATOR (WOUND CARE) ×2 IMPLANT
ELECT REM PT RETURN 9FT ADLT (ELECTROSURGICAL) ×2
ELECTRODE REM PT RTRN 9FT ADLT (ELECTROSURGICAL) ×1 IMPLANT
EVACUATOR 1/8 PVC DRAIN (DRAIN) ×2 IMPLANT
FACESHIELD LNG OPTICON STERILE (SAFETY) ×10 IMPLANT
GAUZE SPONGE 2X2 8PLY STRL LF (GAUZE/BANDAGES/DRESSINGS) ×1 IMPLANT
GLOVE BIOGEL PI IND STRL 7.5 (GLOVE) ×1 IMPLANT
GLOVE BIOGEL PI IND STRL 8 (GLOVE) ×1 IMPLANT
GLOVE BIOGEL PI INDICATOR 7.5 (GLOVE) ×1
GLOVE BIOGEL PI INDICATOR 8 (GLOVE) ×1
GLOVE ECLIPSE 8.0 STRL XLNG CF (GLOVE) ×2 IMPLANT
GLOVE ORTHO TXT STRL SZ7.5 (GLOVE) ×4 IMPLANT
GOWN BRE IMP PREV XXLGXLNG (GOWN DISPOSABLE) ×3 IMPLANT
GOWN STRL NON-REIN LRG LVL3 (GOWN DISPOSABLE) ×2 IMPLANT
HANDPIECE INTERPULSE COAX TIP (DISPOSABLE) ×2
IMMOBILIZER KNEE 20 (SOFTGOODS) ×2
IMMOBILIZER KNEE 20 THIGH 36 (SOFTGOODS) IMPLANT
KIT BASIN OR (CUSTOM PROCEDURE TRAY) ×2 IMPLANT
MANIFOLD NEPTUNE II (INSTRUMENTS) ×2 IMPLANT
NDL SAFETY ECLIPSE 18X1.5 (NEEDLE) ×1 IMPLANT
NEEDLE HYPO 18GX1.5 SHARP (NEEDLE) ×2
NS IRRIG 1000ML POUR BTL (IV SOLUTION) ×3 IMPLANT
PACK TOTAL JOINT (CUSTOM PROCEDURE TRAY) ×2 IMPLANT
POSITIONER SURGICAL ARM (MISCELLANEOUS) ×2 IMPLANT
SET HNDPC FAN SPRY TIP SCT (DISPOSABLE) ×1 IMPLANT
SET PAD KNEE POSITIONER (MISCELLANEOUS) ×2 IMPLANT
SPONGE GAUZE 2X2 STER 10/PKG (GAUZE/BANDAGES/DRESSINGS) ×1
SUCTION FRAZIER 12FR DISP (SUCTIONS) ×2 IMPLANT
SUT MNCRL AB 4-0 PS2 18 (SUTURE) ×2 IMPLANT
SUT VIC AB 1 CT1 36 (SUTURE) ×4 IMPLANT
SUT VIC AB 2-0 CT1 27 (SUTURE) ×6
SUT VIC AB 2-0 CT1 TAPERPNT 27 (SUTURE) ×3 IMPLANT
SYR 50ML LL SCALE MARK (SYRINGE) ×2 IMPLANT
TOWEL OR 17X26 10 PK STRL BLUE (TOWEL DISPOSABLE) ×4 IMPLANT
TRAY FOLEY CATH 14FRSI W/METER (CATHETERS) ×2 IMPLANT
WATER STERILE IRR 1500ML POUR (IV SOLUTION) ×2 IMPLANT
WRAP KNEE MAXI GEL POST OP (GAUZE/BANDAGES/DRESSINGS) ×2 IMPLANT

## 2012-01-21 NOTE — Transfer of Care (Signed)
Immediate Anesthesia Transfer of Care Note  Patient: [name]  Procedure(s) Performed: Procedure(s) (LRB): TOTAL KNEE ARTHROPLASTY (Left)  Patient Location: PACU  Anesthesia Type: General and Spinal  Level of Consciousness: awake, alert , oriented and patient cooperative  Airway & Oxygen Therapy: Patient Spontanous Breathing  Post-op Assessment: Report given to PACU RN and Post -op Vital signs reviewed and stable, moving upper extremities, spinal t11, pt alert and oriented x3  Post vital signs: Reviewed and stable  Complications: unplanned intubation

## 2012-01-21 NOTE — Preoperative (Signed)
Beta Blockers   Reason not to administer Beta Blockers:Not Applicable, Pt no on home BB

## 2012-01-21 NOTE — Op Note (Signed)
NAME:  [name]                      MEDICAL RECORD NO.:  [id number]                             FACILITY:  [facility]      PHYSICIAN:  Madlyn Frankel. Charlann Boxer, M.D.  DATE OF BIRTH:  [birth year]      DATE OF PROCEDURE:  01/21/2012                                     OPERATIVE REPORT         PREOPERATIVE DIAGNOSIS:  Left knee osteoarthritis.      POSTOPERATIVE DIAGNOSIS:  Left knee osteoarthritis.      FINDINGS:  The patient was noted to have complete loss of cartilage and   bone-on-bone arthritis with associated osteophytes in the lateral and patellofemoral compartments of   the knee with a significant synovitis and associated effusion.      PROCEDURE:  Left total knee replacement.      COMPONENTS USED:  DePuy rotating platform posterior stabilized knee   system, a size 2.5 femur, 2.5 tibia, 12.5 mm insert, and 41 patellar   button.      SURGEON:  Madlyn Frankel. Charlann Boxer, M.D.      ASSISTANT:  Lanney Gins, PA-C.      ANESTHESIA:  Spinal and general     SPECIMENS:  None.      COMPLICATION:  None.      DRAINS:  One Hemovac.  EBL: <100cc      TOURNIQUET TIME:   Total Tourniquet Time Documented: Thigh (Left) - 40 minutes at .      The patient was stable to the recovery room.      INDICATION FOR PROCEDURE:  [name] is a 78 y.o. female patient of   mine.  The patient had been seen, evaluated, and treated conservatively in the   office with medication, activity modification, and injections.  The patient had   radiographic changes of bone-on-bone arthritis with endplate sclerosis and osteophytes noted.      The patient failed conservative measures including medication, injections, and activity modification, and at this point was ready for more definitive measures.   Based on the radiographic changes and failed conservative measures, the patient   decided to proceed with total knee replacement.  Risks of infection,   DVT, component failure, need for revision surgery, postop  course, and   expectations were all   discussed and reviewed.  Consent was obtained for benefit of pain   relief.      PROCEDURE IN DETAIL:  The patient was brought to the operative theater.   Once adequate anesthesia, preoperative antibiotics, 2 gm of Ancef administered, the patient was positioned supine with the left thigh tourniquet placed.  The  left lower extremity was prepped and draped in sterile fashion.  A time-   out was performed identifying the patient, planned procedure, and   extremity.      The left lower extremity was placed in the [facility] leg holder.  The leg was   exsanguinated, tourniquet elevated to 250 mmHg.  A midline incision was   made followed by median parapatellar arthrotomy.  Following initial   exposure, attention was first directed to the patella.  Precut   measurement was noted to be 25 mm.  I resected down to 14 mm and used a   41 patellar button to restore patellar height as well as cover the cut   surface.      The lug holes were drilled and a metal shim was placed to protect the   patella from retractors and saw blades.      At this point, attention was now directed to the femur.  The femoral   canal was opened with a drill, irrigated to try to prevent fat emboli.  An   intramedullary rod was passed at 5 degrees valgus, 10 mm of bone was   resected off the distal femur.  Following this resection, the tibia was   subluxated anteriorly.  Using the extramedullary guide, 6 mm of bone was resected off   the proximal lateral tibia.  We confirmed the gap would be   stable medially and laterally with a 10 mm insert as well as confirmed   the cut was perpendicular in the coronal plane, checking with an alignment rod.      Once this was done, I sized the femur to be a size 2.5 in the anterior-   posterior dimension, chose a standard component based on medial and   lateral dimension.  The size 2.5 rotation block was then pinned in   position anterior  referenced using the C-clamp to set rotation.  The   anterior, posterior, and  chamfer cuts were made without difficulty nor   notching making certain that I was along the anterior cortex to help   with flexion gap stability.      The final box cut was made off the lateral aspect of distal femur.      At this point, the tibia was sized to be a size 2.5, the size 2.5 tray was   then pinned in position through the medial third of the tubercle,   drilled, and keel punched.  Trial reduction was now carried with a 2.5 femur,  12.5 tibia, a 12.5 mm insert, and the 41 patella botton.  The knee was brought to   extension, full extension with good flexion stability with the patella   tracking through the trochlea without application of pressure.  Given   all these findings, the trial components removed.  Final components were   opened and cement was mixed.  The knee was irrigated with normal saline   solution and pulse lavage.  The synovial lining was   then injected with 0.25% Marcaine with epinephrine and 1 cc of Toradol,   total of 61 cc.      The knee was irrigated.  Final implants were then cemented onto clean and   dried cut surfaces of bone with the knee brought to extension with a 12.5   mm trial insert.      Once the cement had fully cured, the excess cement was removed   throughout the knee.  I confirmed I was satisfied with the range of   motion and stability, and the final 12.5 mm posterior stabilized insert was chosen.  It was   placed into the knee.      The tourniquet had been let down at 40 minutes.  No significant   hemostasis required.  The medium Hemovac drain was placed deep.  The   extensor mechanism was then reapproximated using #1 Vicryl with the knee   in flexion.  The   remaining wound  was closed with 2-0 Vicryl and running 4-0 Monocryl.   The knee was cleaned, dried, dressed sterilely using Dermabond and   Aquacel dressing.  Drain site dressed separately.  The  patient was then   brought to recovery room in stable condition, tolerating the procedure   well.   Please note that Physician Assistant, Lanney Gins, was present for the entirety of the case, and was utilized for pre-operative positioning, peri-operative retractor management, general facilitation of the procedure.  He was also utilized for primary wound closure at the end of the case.              Madlyn Frankel Charlann Boxer, M.D.

## 2012-01-21 NOTE — Interval H&P Note (Signed)
History and Physical Interval Note:  01/21/2012 8:53 AM  [name]  has presented today for surgery, with the diagnosis of Osteoarthritis of the Left Knee  The various methods of treatment have been discussed with the patient and family. After consideration of risks, benefits and other options for treatment, the patient has consented to  Procedure(s) (LRB): LEFT TOTAL KNEE ARTHROPLASTY (Left) as a surgical intervention .  The patients' history has been reviewed, patient examined, no change in status, stable for surgery.  I have reviewed the patients' chart and labs.  Questions were answered to the patient's satisfaction.     [name]

## 2012-01-21 NOTE — Anesthesia Postprocedure Evaluation (Addendum)
  Anesthesia Post-op Note  Patient: [name]  Procedure(s) Performed: Procedure(s) (LRB): TOTAL KNEE ARTHROPLASTY (Left)  Patient Location: PACU  Anesthesia Type: Spinal  Level of Consciousness: awake and alert   Airway and Oxygen Therapy: Patient Spontanous Breathing  Post-op Pain: mild  Post-op Assessment: Post-op Vital signs reviewed, Patient's Cardiovascular Status Stable, Respiratory Function Stable, Patent Airway and No signs of Nausea or vomiting  Post-op Vital Signs: stable  Complications: No apparent anesthesia complications.  Likely episode of intraop fat embolism at near time of cemeting of implant. Transient hypoxemia. Resolved within 5 min. Pt doing very well in PACU.

## 2012-01-21 NOTE — Anesthesia Procedure Notes (Signed)
Spinal Patient location during procedure: OR Staffing Anesthesiologist: Joon Pohle Performed by: anesthesiologist  Preanesthetic Checklist Completed: patient identified, site marked, surgical consent, pre-op evaluation, timeout performed, IV checked, risks and benefits discussed and monitors and equipment checked Spinal Block Patient position: sitting Prep: Betadine Patient monitoring: heart rate, continuous pulse ox and blood pressure Approach: left paramedian Location: L3-4 Injection technique: single-shot Needle Needle type: Spinocan  Needle gauge: 22 G Needle length: 9 cm Additional Notes Expiration date of kit checked and confirmed. Patient tolerated procedure well, without complications.     

## 2012-01-21 NOTE — Anesthesia Preprocedure Evaluation (Addendum)
Anesthesia Evaluation  Patient identified by MRN, date of birth, ID band Patient awake    Reviewed: Allergy & Precautions, H&P , NPO status , Patient's Chart, lab work & pertinent test results  Airway Mallampati: II TM Distance: >3 FB Neck ROM: Full    Dental No notable dental hx. (+) Edentulous Upper and Edentulous Lower   Pulmonary neg pulmonary ROS,  breath sounds clear to auscultation  Pulmonary exam normal       Cardiovascular hypertension, Pt. on medications negative cardio ROS  + Valvular Problems/Murmurs (benign murmur) Rhythm:Regular Rate:Normal     Neuro/Psych negative neurological ROS  negative psych ROS   GI/Hepatic negative GI ROS, Neg liver ROS,   Endo/Other  negative endocrine ROS  Renal/GU negative Renal ROS  negative genitourinary   Musculoskeletal negative musculoskeletal ROS (+)   Abdominal   Peds negative pediatric ROS (+)  Hematology negative hematology ROS (+)   Anesthesia Other Findings   Reproductive/Obstetrics negative OB ROS                          Anesthesia Physical Anesthesia Plan  ASA: II  Anesthesia Plan: Spinal   Post-op Pain Management:    Induction:   Airway Management Planned:   Additional Equipment:   Intra-op Plan:   Post-operative Plan:   Informed Consent: I have reviewed the patients History and Physical, chart, labs and discussed the procedure including the risks, benefits and alternatives for the proposed anesthesia with the patient or authorized representative who has indicated his/her understanding and acceptance.   Dental advisory given  Plan Discussed with: CRNA  Anesthesia Plan Comments:        Anesthesia Quick Evaluation

## 2012-01-22 ENCOUNTER — Inpatient Hospital Stay (HOSPITAL_COMMUNITY): Payer: Medicare Other

## 2012-01-22 LAB — CBC
HCT: 33.9 % — ABNORMAL LOW (ref 36.0–46.0)
MCH: 28.2 pg (ref 26.0–34.0)
MCV: 86 fL (ref 78.0–100.0)
Platelets: 204 10*3/uL (ref 150–400)
RBC: 3.94 MIL/uL (ref 3.87–5.11)
WBC: 12.9 10*3/uL — ABNORMAL HIGH (ref 4.0–10.5)

## 2012-01-22 LAB — BASIC METABOLIC PANEL
BUN: 14 mg/dL (ref 6–23)
CO2: 26 mEq/L (ref 19–32)
Calcium: 9.5 mg/dL (ref 8.4–10.5)
Chloride: 101 mEq/L (ref 96–112)
Creatinine, Ser: 0.77 mg/dL (ref 0.50–1.10)

## 2012-01-22 NOTE — Progress Notes (Signed)
Clinical Social Work Department CLINICAL SOCIAL WORK PLACEMENT NOTE 01/22/2012  Patient:  [name], [name]  Account Number:  [id number] Admit date:  01/21/2012  Clinical Social Worker:  Cori Razor, LCSW  Date/time:  01/22/2012 02:12 PM  Clinical Social Work is seeking post-discharge placement for this patient at the following level of care:   SKILLED NURSING   (*CSW will update this form in Epic as items are completed)   01/22/2012  Patient/family provided with [facility] Department of Clinical Social Work's list of facilities offering this level of care within the geographic area requested by the patient (or if unable, by the patient's family).    Patient/family informed of their freedom to choose among providers that offer the needed level of care, that participate in Medicare, Medicaid or managed care program needed by the patient, have an available bed and are willing to accept the patient.    Patient/family informed of [facility]' ownership interest in [facility], as well as of the fact that they are under no obligation to receive care at this facility.  PASARR submitted to EDS on 01/22/2012 PASARR number received from [employer] on 01/22/2012  FL2 transmitted to all facilities in geographic area requested by pt/family on  01/22/2012 FL2 transmitted to all facilities within larger geographic area on   Patient informed that his/her managed care company has contracts with or will negotiate with  certain facilities, including the following:     Patient/family informed of bed offers received:  01/22/2012 Patient chooses bed at [facility], [facility] Physician recommends and patient chooses bed at    Patient to be transferred to  on   Patient to be transferred to facility by   The following physician request were entered in Epic:   Additional Comments:  Cori Razor LCSW [phone]

## 2012-01-22 NOTE — Progress Notes (Signed)
Physical Therapy Treatment Patient Details Name: JOZALYN BAGLIO MRN: [id number] DOB: Jan 26, 1933 Today's Date: 01/22/2012 Time: 6295-2841 PT Time Calculation (min): 23 min  PT Assessment / Plan / Recommendation Comments on Treatment Session       Follow Up Recommendations  Skilled nursing facility    Barriers to Discharge Decreased caregiver support      Equipment Recommendations  Defer to next venue    Recommendations for Other Services OT consult  Frequency 7X/week   Plan Discharge plan remains appropriate    Precautions / Restrictions Precautions Precautions: Knee Required Braces or Orthoses: Knee Immobilizer - Left Knee Immobilizer - Left: Discontinue once straight leg raise with < 10 degree lag (Pt performed IND SLR) Restrictions Weight Bearing Restrictions: No Other Position/Activity Restrictions: WBAT   Pertinent Vitals/Pain 4/10    Mobility  Bed Mobility Bed Mobility: Supine to Sit Supine to Sit: 4: Min assist Sit to Supine: 4: Min assist Details for Bed Mobility Assistance: cues for sequence and for self assist with R LE Transfers Transfers: Sit to Stand;Stand to Sit Sit to Stand: 4: Min assist Stand to Sit: 4: Min assist Details for Transfer Assistance: cues for use of UEs and for LE management Ambulation/Gait Ambulation/Gait Assistance: 4: Min assist Ambulation Distance (Feet): 85 Feet (85' and 25') Assistive device: Rolling walker Ambulation/Gait Assistance Details: cues for posture, sequence and position from RW Gait Pattern: Step-to pattern    Exercises Total Joint Exercises Ankle Circles/Pumps: AROM;10 reps;Supine;Both Quad Sets: AROM;10 reps;Both;Supine Heel Slides: AAROM;10 reps;Supine;Left Straight Leg Raises: AAROM;AROM;15 reps;Left;Supine   PT Diagnosis: Difficulty walking  PT Problem List: Decreased strength;Decreased activity tolerance;Decreased range of motion;Decreased knowledge of use of DME;Pain;Obesity;Decreased mobility PT Treatment  Interventions: DME instruction;Gait training;Functional mobility training;Stair training;Therapeutic activities;Therapeutic exercise;Patient/family education   PT Goals Acute Rehab PT Goals PT Goal Formulation: With patient Time For Goal Achievement: 01/28/12 Potential to Achieve Goals: Good Pt will go Supine/Side to Sit: with supervision PT Goal: Supine/Side to Sit - Progress: Goal set today Pt will go Sit to Supine/Side: with supervision PT Goal: Sit to Supine/Side - Progress: Goal set today Pt will go Sit to Stand: with supervision PT Goal: Sit to Stand - Progress: Progressing toward goal Pt will go Stand to Sit: with supervision PT Goal: Stand to Sit - Progress: Progressing toward goal Pt will Ambulate: 51 - 150 feet;with supervision;with rolling walker PT Goal: Ambulate - Progress: Progressing toward goal  Visit Information  Last PT Received On: 01/22/12 Assistance Needed: +1    Subjective Data  Subjective: I'm doing good Patient Stated Goal: Resume previous lifestyle with decreased pain   Cognition  Overall Cognitive Status: Appears within functional limits for tasks assessed/performed Arousal/Alertness: Awake/alert Orientation Level: Appears intact for tasks assessed Behavior During Session: [facility] for tasks performed    Balance     End of Session PT - End of Session Activity Tolerance: Patient tolerated treatment well Patient left: in bed;with call bell/phone within reach;with family/visitor present Nurse Communication: Mobility status    Diallo Ponder 01/22/2012, 2:24 PM

## 2012-01-22 NOTE — Evaluation (Signed)
Physical Therapy Evaluation Patient Details Name: [name] MRN: [id number] DOB: May 22, 1933 Today's Date: 01/22/2012 Time: 1035-1100 PT Time Calculation (min): 25 min  PT Assessment / Plan / Recommendation Clinical Impression  Pt wtih L TKR presents with decreased L LE strength/ROM and limitations in functional mobility and abiltiy to self care.    PT Assessment  Patient needs continued PT services    Follow Up Recommendations  Skilled nursing facility    Barriers to Discharge Decreased caregiver support      lEquipment Recommendations  Defer to next venue    Recommendations for Other Services OT consult   Frequency 7X/week    Precautions / Restrictions Precautions Precautions: Knee Required Braces or Orthoses: Knee Immobilizer - Left Knee Immobilizer - Left: Discontinue once straight leg raise with < 10 degree lag Restrictions Weight Bearing Restrictions: No Other Position/Activity Restrictions: WBAT   Pertinent Vitals/Pain 4/10: premedicated, cold packs provided      Mobility  Bed Mobility Bed Mobility: Sit to Supine Sit to Supine: 4: Min assist Details for Bed Mobility Assistance: cues for sequence and for self assist with R LE Transfers Transfers: Sit to Stand;Stand to Sit Sit to Stand: 4: Min assist Stand to Sit: 4: Min assist Details for Transfer Assistance: cues for use of UEs and for LE management Ambulation/Gait Ambulation/Gait Assistance: 4: Min assist Ambulation Distance (Feet): 39 Feet Assistive device: Rolling walker Ambulation/Gait Assistance Details: cues for sequence, posture, position from RW and stride length Gait Pattern: Step-to pattern    Exercises Total Joint Exercises Ankle Circles/Pumps: AROM;10 reps;Supine;Both Quad Sets: AROM;10 reps;Both;Supine Heel Slides: AAROM;10 reps;Supine;Left Straight Leg Raises: AAROM;AROM;15 reps;Left;Supine   PT Diagnosis: Difficulty walking  PT Problem List: Decreased strength;Decreased activity  tolerance;Decreased range of motion;Decreased knowledge of use of DME;Pain;Obesity;Decreased mobility PT Treatment Interventions: DME instruction;Gait training;Functional mobility training;Stair training;Therapeutic activities;Therapeutic exercise;Patient/family education   PT Goals Acute Rehab PT Goals PT Goal Formulation: With patient Time For Goal Achievement: 01/28/12 Potential to Achieve Goals: Good Pt will go Supine/Side to Sit: with supervision PT Goal: Supine/Side to Sit - Progress: Goal set today Pt will go Sit to Supine/Side: with supervision PT Goal: Sit to Supine/Side - Progress: Goal set today Pt will go Sit to Stand: with supervision PT Goal: Sit to Stand - Progress: Goal set today Pt will go Stand to Sit: with supervision PT Goal: Stand to Sit - Progress: Goal set today Pt will Ambulate: 51 - 150 feet;with supervision;with rolling walker PT Goal: Ambulate - Progress: Goal set today  Visit Information  Last PT Received On: 01/22/12 Assistance Needed: +1    Subjective Data  Subjective: The Dr said I should have had this surgery years ago Patient Stated Goal: Resume previous lifestyle with decreased pain   Prior Functioning  Home Living Lives With: Alone Prior Function Level of Independence: Independent Able to Take Stairs?: Yes Driving: Yes Communication Communication: No difficulties    Cognition  Overall Cognitive Status: Appears within functional limits for tasks assessed/performed Arousal/Alertness: Awake/alert Orientation Level: Appears intact for tasks assessed Behavior During Session: [facility] for tasks performed    Extremity/Trunk Assessment Right Upper Extremity Assessment RUE ROM/Strength/Tone: Within functional levels Left Upper Extremity Assessment LUE ROM/Strength/Tone: WFL for tasks assessed Right Lower Extremity Assessment RLE ROM/Strength/Tone: [facility] for tasks assessed Left Lower Extremity Assessment LLE ROM/Strength/Tone: Deficits LLE  ROM/Strength/Tone Deficits: 3/5 QUAD STGRENGTH; -10 - 80 aarom at knee   Balance    End of Session PT - End of Session Activity Tolerance: Patient tolerated treatment  well Patient left: in chair;with call bell/phone within reach Nurse Communication: Mobility status   Jerod Mcquain 01/22/2012, 12:15 PM

## 2012-01-22 NOTE — Care Management Note (Signed)
    Page 1 of 2   01/22/2012     6:45:05 PM   CARE MANAGEMENT NOTE 01/22/2012  Patient:  [name], [name]   Account Number:  [id number]  Date Initiated:  01/22/2012  Documentation initiated by:  Colleen Can  Subjective/Objective Assessment:   dx osteoarthritis left knee; total knee replacemnt on day of admission     Action/Plan:   CM spoke with patient. Plans are for SNF rehab   Anticipated DC Date:  01/24/2012   Anticipated DC Plan:  SKILLED NURSING FACILITY  In-house referral  Clinical Social Worker      DC Planning Services  CM consult      [facility] Choice  NA   Choice offered to / List presented to:  NA   DME arranged  NA      DME agency  NA     HH arranged  NA      HH agency  NA   Status of service:  Completed, signed off Medicare Important Message given?  NA - LOS <3 / Initial given by admissions (If response is "NO", the following Medicare IM given date fields will be blank) Date Medicare IM given:   Date Additional Medicare IM given:    Discharge Disposition:    Per UR Regulation:    If discussed at Long Length of Stay Meetings, dates discussed:    Comments:

## 2012-01-22 NOTE — Progress Notes (Signed)
Clinical Social Work Department BRIEF PSYCHOSOCIAL ASSESSMENT 01/22/2012  Patient:  [name], [name]     Account Number:  [id number]     Admit date:  01/21/2012  Clinical Social Worker:  Candie Chroman  Date/Time:  01/22/2012 02:05 PM  Referred by:  Physician  Date Referred:  01/22/2012 Referred for  SNF Placement   Other Referral:   Interview type:  Patient Other interview type:    PSYCHOSOCIAL DATA Living Status:  ALONE Admitted from facility:   Level of care:   Primary support name:  Bonnye Fava Primary support relationship to patient:  CHILD, ADULT Degree of support available:   unclear    CURRENT CONCERNS Current Concerns  Post-Acute Placement   Other Concerns:    SOCIAL WORK ASSESSMENT / PLAN Pt is a 78 yr old female living at home prior to hospitalization. CSW met with pt to assist with d/c planning. Pt plans to have ST rehab at [facility] following hospitalization. SNF contacted and plan confirmed. CSW will assist with d/c planning to SNF.   Assessment/plan status:  Psychosocial Support/Ongoing Assessment of Needs Other assessment/ plan:   Information/referral to community resources:   SNF list provided .    PATIENT'S/FAMILY'S RESPONSE TO PLAN OF CARE: Pt had contacted [facility] prior to surgery to make arrangements for ST rehab.     Cori Razor LCSW [phone]

## 2012-01-22 NOTE — Progress Notes (Signed)
Subjective: 1 Day Post-Op Procedure(s) (LRB): TOTAL KNEE ARTHROPLASTY (Left)   Patient reports pain as mild, pain well controlled. No events throughout the night.  Objective:   VITALS:   Filed Vitals:   01/22/12 0616  BP: 112/70  Pulse: 75  Temp: 98.1 F (36.7 C)  Resp: 16    Neurovascular intact Dorsiflexion/Plantar flexion intact Incision: dressing C/D/I No cellulitis present Compartment soft  LABS  Basename 01/22/12 0430  HGB 11.1*  HCT 33.9*  WBC 12.9*  PLT 204     Basename 01/22/12 0430  NA 136  K 3.5  BUN 14  CREATININE 0.77  GLUCOSE 148*     Assessment/Plan: 1 Day Post-Op Procedure(s) (LRB): TOTAL KNEE ARTHROPLASTY (Left)   HV drain d/c'ed Foley cath d/c'ed Advance diet Up with therapy D/C IV fluids Discharge to Rehab, [facility], when ready   Anastasio Auerbach. Shirla Hodgkiss   PAC  01/22/2012, 9:33 AM

## 2012-01-23 ENCOUNTER — Encounter (HOSPITAL_COMMUNITY): Payer: Self-pay | Admitting: Orthopedic Surgery

## 2012-01-23 LAB — BASIC METABOLIC PANEL
BUN: 16 mg/dL (ref 6–23)
Calcium: 9.5 mg/dL (ref 8.4–10.5)
Creatinine, Ser: 0.9 mg/dL (ref 0.50–1.10)
GFR calc Af Amer: 70 mL/min — ABNORMAL LOW (ref >90)
GFR calc non Af Amer: 60 mL/min — ABNORMAL LOW (ref >90)

## 2012-01-23 LAB — CBC
HCT: 31.2 % — ABNORMAL LOW (ref 36.0–46.0)
MCH: 28.3 pg (ref 26.0–34.0)
MCHC: 32.7 g/dL (ref 30.0–36.0)
MCV: 86.4 fL (ref 78.0–100.0)
Platelets: 145 10*3/uL — ABNORMAL LOW (ref 150–400)
RDW: 13.6 % (ref 11.5–15.5)
WBC: 8.6 10*3/uL (ref 4.0–10.5)

## 2012-01-23 NOTE — Progress Notes (Signed)
Physical Therapy Treatment Patient Details Name: [name] MRN: [id number] DOB: 1933/02/27 Today's Date: 01/23/2012 Time: 4098-1191 PT Time Calculation (min): 20 min  PT Assessment / Plan / Recommendation Comments on Treatment Session       Follow Up Recommendations  Skilled nursing facility    Barriers to Discharge        Equipment Recommendations  Defer to next venue    Recommendations for Other Services OT consult  Frequency 7X/week   Plan Discharge plan remains appropriate    Precautions / Restrictions Precautions Precautions: Knee Required Braces or Orthoses: Knee Immobilizer - Left Knee Immobilizer - Left: Discontinue once straight leg raise with < 10 degree lag (Pt performed IND SLR) Restrictions Weight Bearing Restrictions: No Other Position/Activity Restrictions: WBAT   Pertinent Vitals/Pain     Mobility  Bed Mobility Bed Mobility: Supine to Sit Supine to Sit: 4: Min assist Details for Bed Mobility Assistance: cues for sequence and for self assist with R LE Transfers Transfers: Sit to Stand;Stand to Sit Sit to Stand: 4: Min assist;With upper extremity assist;From chair/3-in-1 Stand to Sit: 4: Min assist;With upper extremity assist;To chair/3-in-1 Details for Transfer Assistance: min verbal cues for hand placement Ambulation/Gait Ambulation/Gait Assistance: 4: Min assist Ambulation Distance (Feet): 111 Feet Assistive device: Rolling walker Ambulation/Gait Assistance Details: cues for posture and position from RW Gait Pattern: Step-to pattern        PT Diagnosis:    PT Problem List:   PT Treatment Interventions:     PT Goals Acute Rehab PT Goals PT Goal Formulation: With patient Time For Goal Achievement: 01/28/12 Potential to Achieve Goals: Good Pt will go Supine/Side to Sit: with supervision PT Goal: Supine/Side to Sit - Progress: Progressing toward goal Pt will go Sit to Supine/Side: with supervision PT Goal: Sit to Supine/Side - Progress:  Progressing toward goal Pt will go Sit to Stand: with supervision PT Goal: Sit to Stand - Progress: Progressing toward goal Pt will go Stand to Sit: with supervision PT Goal: Stand to Sit - Progress: Progressing toward goal Pt will Ambulate: 51 - 150 feet;with supervision;with rolling walker PT Goal: Ambulate - Progress: Progressing toward goal  Visit Information  Last PT Received On: 01/23/12 Assistance Needed: +1    Subjective Data  Subjective: No new complaints Patient Stated Goal: Resume previous lifestyle with decreased pain   Cognition  Overall Cognitive Status: Appears within functional limits for tasks assessed/performed Arousal/Alertness: Awake/alert Orientation Level: Appears intact for tasks assessed Behavior During Session: [facility] for tasks performed    Balance  Balance Balance Assessed: Yes Dynamic Standing Balance Dynamic Standing - Level of Assistance: 4: Min assist;Other (comment) (washing hands at sink)  End of Session PT - End of Session Activity Tolerance: Patient tolerated treatment well Patient left: in chair;with call bell/phone within reach Nurse Communication: Mobility status    Myrakle Wingler 01/23/2012, 2:36 PM

## 2012-01-23 NOTE — Evaluation (Signed)
Occupational Therapy Evaluation Patient Details Name: [name] MRN: [id number] DOB: 01/17/1933 Today's Date: 01/23/2012 Time: 4098-1191 OT Time Calculation (min): 19 min  OT Assessment / Plan / Recommendation Clinical Impression  Pt is a 78 yo s/p L TKA and displays decreased overall strength and independence with ADL. Will benefit from skilled OT services to improve safety and independence with self care tasks.     OT Assessment  Patient needs continued OT Services    Follow Up Recommendations  Skilled nursing facility    Barriers to Discharge      Equipment Recommendations Defer to next venue    Recommendations for Other Services    Frequency  Min 2X/week    Precautions / Restrictions Precautions Precautions: Knee Required Braces or Orthoses: Knee Immobilizer - Left Knee Immobilizer - Left: Discontinue once straight leg raise with < 10 degree lag;Other (comment) (per PT note, performed SLR)        ADL  Eating/Feeding: Simulated;Independent Where Assessed - Eating/Feeding: Chair Grooming: Performed;Wash/dry hands;Minimal assistance Where Assessed - Grooming: Unsupported standing Upper Body Bathing: Simulated;Chest;Right arm;Left arm;Abdomen;Set up Where Assessed - Upper Body Bathing: Unsupported sitting Lower Body Bathing: Simulated;Minimal assistance Where Assessed - Lower Body Bathing: Supported sit to stand Upper Body Dressing: Simulated;Set up Where Assessed - Upper Body Dressing: Unsupported sitting Lower Body Dressing: Simulated;Moderate assistance Where Assessed - Lower Body Dressing: Sopported sit to stand Toilet Transfer: Performed;Minimal assistance Toilet Transfer Method: Other (comment) (ambulating to 3in1) Toilet Transfer Equipment: Raised toilet seat with arms (or 3-in-1 over toilet) Toileting - Clothing Manipulation and Hygiene: Performed;Minimal assistance Where Assessed - Glass blower/designer Manipulation and Hygiene: Standing Tub/Shower Transfer  Method: Not assessed Equipment Used: Rolling walker    OT Diagnosis: Generalized weakness  OT Problem List: Decreased strength;Decreased knowledge of use of DME or AE OT Treatment Interventions: Self-care/ADL training;Therapeutic activities;DME and/or AE instruction;Patient/family education   OT Goals Acute Rehab OT Goals OT Goal Formulation: With patient Time For Goal Achievement: 01/30/12 Potential to Achieve Goals: Good ADL Goals Pt Will Perform Grooming: with supervision;Standing at sink ADL Goal: Grooming - Progress: Goal set today Pt Will Perform Lower Body Bathing: with supervision;Sit to stand from chair;Sit to stand from bed ADL Goal: Lower Body Bathing - Progress: Goal set today Pt Will Perform Lower Body Dressing: with min assist;Sit to stand from chair;Sit to stand from bed ADL Goal: Lower Body Dressing - Progress: Goal set today Pt Will Transfer to Toilet: with supervision;with DME;Ambulation;3-in-1 ADL Goal: Toilet Transfer - Progress: Goal set today Pt Will Perform Toileting - Clothing Manipulation: with supervision;Standing ADL Goal: Toileting - Clothing Manipulation - Progress: Goal set today  Visit Information  Last OT Received On: 01/23/12 Assistance Needed: +1    Subjective Data  Subjective: I can walk into the bathroom again Patient Stated Goal: to return home independent   Prior Functioning  Home Living Lives With: Alone Type of Home: Skilled Nursing Facility Bathroom Shower/Tub: Nurse, adult Toilet: Handicapped height Home Adaptive Equipment: Shower chair with back Prior Function Level of Independence: Independent Able to Take Stairs?: Yes Driving: Yes Communication Communication: No difficulties    Cognition  Overall Cognitive Status: Appears within functional limits for tasks assessed/performed Arousal/Alertness: Awake/alert Orientation Level: Appears intact for tasks assessed Behavior During Session: [facility] for tasks performed      Extremity/Trunk Assessment Right Upper Extremity Assessment RUE ROM/Strength/Tone: [facility] for tasks assessed Left Upper Extremity Assessment LUE ROM/Strength/Tone: [facility] for tasks assessed (pt reports history of shoulder surgery)   Mobility  Transfers Transfers: Sit to Stand;Stand to Sit Sit to Stand: 4: Min assist;With upper extremity assist;From chair/3-in-1 Stand to Sit: 4: Min assist;With upper extremity assist;To chair/3-in-1 Details for Transfer Assistance: min verbal cues for hand placement   Exercise    Balance Balance Balance Assessed: Yes Dynamic Standing Balance Dynamic Standing - Level of Assistance: 4: Min assist;Other (comment) (washing hands at sink)  End of Session OT - End of Session Activity Tolerance: Patient tolerated treatment well Patient left: in chair;with call bell/phone within reach   Lennox Laity [phone] 01/23/2012, 11:59 AM

## 2012-01-23 NOTE — Progress Notes (Signed)
Physical Therapy Treatment Patient Details Name: [name] MRN: [id number] DOB: 1933/01/08 Today's Date: 01/23/2012 Time: 5621-3086 PT Time Calculation (min): 23 min  PT Assessment / Plan / Recommendation Comments on Treatment Session  Pt very motivated and progressing well    Follow Up Recommendations  Skilled nursing facility    Barriers to Discharge        Equipment Recommendations  Defer to next venue    Recommendations for Other Services OT consult  Frequency 7X/week   Plan Discharge plan remains appropriate    Precautions / Restrictions Precautions Precautions: Knee Required Braces or Orthoses: Knee Immobilizer - Left Knee Immobilizer - Left: Discontinue once straight leg raise with < 10 degree lag Restrictions Weight Bearing Restrictions: No Other Position/Activity Restrictions: WBAT   Pertinent Vitals/Pain      Mobility  Bed Mobility Bed Mobility: Supine to Sit Supine to Sit: 4: Min guard Sit to Supine: 4: Min guard Details for Bed Mobility Assistance: cues for sequence and for self assist with R LE Transfers Transfers: Sit to Stand;Stand to Sit Sit to Stand: 4: Min guard Stand to Sit: 4: Min guard Details for Transfer Assistance: min verbal cues for hand placement Ambulation/Gait Ambulation/Gait Assistance: 4: Min assist Ambulation Distance (Feet): 150 Feet Assistive device: Rolling walker Ambulation/Gait Assistance Details: cues for posture and position from RW Gait Pattern: Step-to pattern    Exercises Total Joint Exercises Ankle Circles/Pumps: AROM;20 reps;Supine;Both Quad Sets: AROM;20 reps;Both;Supine Heel Slides: AAROM;20 reps;Left Straight Leg Raises: AAROM;AROM;20 reps;Left;Supine   PT Diagnosis:    PT Problem List:   PT Treatment Interventions:     PT Goals Acute Rehab PT Goals PT Goal Formulation: With patient Time For Goal Achievement: 01/28/12 Potential to Achieve Goals: Good Pt will go Supine/Side to Sit: with supervision PT  Goal: Supine/Side to Sit - Progress: Progressing toward goal Pt will go Sit to Supine/Side: with supervision PT Goal: Sit to Supine/Side - Progress: Progressing toward goal Pt will go Sit to Stand: with supervision PT Goal: Sit to Stand - Progress: Progressing toward goal Pt will go Stand to Sit: with supervision PT Goal: Stand to Sit - Progress: Progressing toward goal Pt will Ambulate: 51 - 150 feet;with supervision;with rolling walker PT Goal: Ambulate - Progress: Progressing toward goal  Visit Information  Last PT Received On: 01/23/12 Assistance Needed: +1    Subjective Data  Subjective: No new complaints Patient Stated Goal: Resume previous lifestyle with decreased pain   Cognition  Overall Cognitive Status: Appears within functional limits for tasks assessed/performed Arousal/Alertness: Awake/alert Orientation Level: Appears intact for tasks assessed Behavior During Session: [facility] for tasks performed    Balance     End of Session PT - End of Session Activity Tolerance: Patient tolerated treatment well Patient left: in bed;with call bell/phone within reach Nurse Communication: Mobility status    Raghav Verrilli 01/23/2012, 4:04 PM

## 2012-01-23 NOTE — Progress Notes (Signed)
Physical Therapy Treatment Patient Details Name: [name] MRN: [id number] DOB: 23-Jun-1933 Today's Date: 01/23/2012 Time: 4098-1191 PT Time Calculation (min): 19 min  PT Assessment / Plan / Recommendation Comments on Treatment Session       Follow Up Recommendations  Skilled nursing facility    Barriers to Discharge        Equipment Recommendations  Defer to next venue    Recommendations for Other Services OT consult  Frequency 7X/week   Plan Discharge plan remains appropriate    Precautions / Restrictions Precautions Precautions: Knee Required Braces or Orthoses: Knee Immobilizer - Left Knee Immobilizer - Left: Discontinue once straight leg raise with < 10 degree lag;Other (comment) Restrictions Weight Bearing Restrictions: No Other Position/Activity Restrictions: WBAT   Pertinent Vitals/Pain        Exercises Total Joint Exercises Ankle Circles/Pumps: AROM;20 reps;Supine;Both Quad Sets: AROM;20 reps;Both;Supine Heel Slides: AAROM;20 reps;Left Straight Leg Raises: AAROM;AROM;20 reps;Left;Supine   PT Diagnosis:    PT Problem List:   PT Treatment Interventions:     PT Goals Acute Rehab PT Goals PT Goal Formulation: With patient Time For Goal Achievement: 01/28/12  Visit Information  Last PT Received On: 01/23/12 Assistance Needed: +1    Subjective Data  Subjective: No new complaints Patient Stated Goal: Resume previous lifestyle with decreased pain   Cognition  Overall Cognitive Status: Appears within functional limits for tasks assessed/performed Arousal/Alertness: Awake/alert Orientation Level: Appears intact for tasks assessed Behavior During Session: [facility] for tasks performed    Balance  Balance Balance Assessed: Yes Dynamic Standing Balance Dynamic Standing - Level of Assistance: 4: Min assist;Other (comment) (washing hands at sink)  End of Session PT - End of Session Activity Tolerance: Patient tolerated treatment well Patient left: in  bed;with call bell/phone within reach;with family/visitor present    [name] 01/23/2012, 2:30 PM

## 2012-01-23 NOTE — Progress Notes (Signed)
  Subjective: 2 Days Post-Op Procedure(s) (LRB): TOTAL KNEE ARTHROPLASTY (Left)   Patient reports pain as mild, pain well controlled. No events throughout the night. She is down to 1 L/min to sustained O2 sats. When she is taken off of the O2 her sats drop, but she is not symptomatic at all. She admits that her husband was a 63 year smoker that died of lung CA in January 28, 2002, which is probably effecting her O2 sats.  Objective:   VITALS:   Filed Vitals:   01/23/12 0628  BP: 96/63  Pulse: 79  Temp: 97.8 F (36.6 C)  Resp: 16    Neurovascular intact Dorsiflexion/Plantar flexion intact Incision: dressing C/D/I No cellulitis present Compartment soft  LABS  Basename 01/23/12 0504 01/22/12 0430  HGB 10.2* 11.1*  HCT 31.2* 33.9*  WBC 8.6 12.9*  PLT 145* 204     Basename 01/23/12 0504 01/22/12 0430  NA 135 136  K 3.2* 3.5  BUN 16 14  CREATININE 0.90 0.77  GLUCOSE 113* 148*     Assessment/Plan: 2 Days Post-Op Procedure(s) (LRB): TOTAL KNEE ARTHROPLASTY (Left)   Up with therapy Plan for discharge tomorrow to Rehab if she continues to do well.   Anastasio Auerbach Burnie Hank   PAC  01/23/2012, 9:59 AM

## 2012-01-24 MED ORDER — POLYETHYLENE GLYCOL 3350 17 G PO PACK: 17.0000 g | PACK | Freq: Two times a day (BID) | ORAL | Status: AC

## 2012-01-24 MED ORDER — DSS 100 MG PO CAPS: 100.0000 mg | ORAL_CAPSULE | Freq: Two times a day (BID) | ORAL | Status: AC

## 2012-01-24 MED ORDER — DIPHENHYDRAMINE HCL 25 MG PO CAPS: 25.0000 mg | ORAL_CAPSULE | Freq: Four times a day (QID) | ORAL | Status: DC | PRN

## 2012-01-24 MED ORDER — HYDROCODONE-ACETAMINOPHEN 7.5-325 MG PO TABS
1.0000 | ORAL_TABLET | ORAL | Status: AC
Start: 2012-01-24 — End: 2012-02-03

## 2012-01-24 MED ORDER — METHOCARBAMOL 500 MG PO TABS: 500.0000 mg | ORAL_TABLET | Freq: Four times a day (QID) | ORAL | Status: AC | PRN

## 2012-01-24 MED ORDER — CELECOXIB 200 MG PO CAPS: 200.0000 mg | ORAL_CAPSULE | Freq: Two times a day (BID) | ORAL | Status: AC

## 2012-01-24 MED ORDER — ASPIRIN EC 325 MG PO TBEC: 325.0000 mg | DELAYED_RELEASE_TABLET | Freq: Two times a day (BID) | ORAL | Status: AC

## 2012-01-24 MED ORDER — FERROUS SULFATE 325 (65 FE) MG PO TABS: 325.0000 mg | ORAL_TABLET | Freq: Three times a day (TID) | ORAL | Status: DC

## 2012-01-24 NOTE — Progress Notes (Signed)
Clinical Social Work Department CLINICAL SOCIAL WORK PLACEMENT NOTE 01/24/2012  Patient:  DEBARAH, MCCUMBERS  Account Number:  [id number] Admit date:  01/21/2012  Clinical Social Worker:  Cori Razor, LCSW  Date/time:  01/22/2012 02:12 PM  Clinical Social Work is seeking post-discharge placement for this patient at the following level of care:   SKILLED NURSING   (*CSW will update this form in Epic as items are completed)   01/22/2012  Patient/family provided with [facility] Department of Clinical Social Work's list of facilities offering this level of care within the geographic area requested by the patient (or if unable, by the patient's family).    Patient/family informed of their freedom to choose among providers that offer the needed level of care, that participate in Medicare, Medicaid or managed care program needed by the patient, have an available bed and are willing to accept the patient.    Patient/family informed of [facility]' ownership interest in [facility], as well as of the fact that they are under no obligation to receive care at this facility.  PASARR submitted to EDS on 01/22/2012 PASARR number received from EDS on 01/22/2012  FL2 transmitted to all facilities in geographic area requested by pt/family on  01/22/2012 FL2 transmitted to all facilities within larger geographic area on   Patient informed that his/her managed care company has contracts with or will negotiate with  certain facilities, including the following:     Patient/family informed of bed offers received:  01/22/2012 Patient chooses bed at [place], [facility] Physician recommends and patient chooses bed at    Patient to be transferred to [place], [facility] on  01/24/2012 Patient to be transferred to facility by [facility]  The following physician request were entered in Epic:   Additional Comments:  Cori Razor LCSW [phone]

## 2012-01-24 NOTE — Progress Notes (Signed)
  Subjective: 3 Days Post-Op Procedure(s) (LRB): TOTAL KNEE ARTHROPLASTY (Left)   Patient reports pain as mild, well controlled with medication. No events throughout the night. She is driven to get better. Ready to be discharged.  Objective:   VITALS:   Filed Vitals:   01/24/12 0526  BP: 103/68  Pulse: 95  Temp: 97.9 F (36.6 C)  Resp: 16    Neurovascular intact Dorsiflexion/Plantar flexion intact Incision: dressing C/D/I No cellulitis present Compartment soft  LABS  Basename 01/23/12 0504 01/22/12 0430  HGB 10.2* 11.1*  HCT 31.2* 33.9*  WBC 8.6 12.9*  PLT 145* 204     Basename 01/23/12 0504 01/22/12 0430  NA 135 136  K 3.2* 3.5  BUN 16 14  CREATININE 0.90 0.77  GLUCOSE 113* 148*     Assessment/Plan: 3 Days Post-Op Procedure(s) (LRB): TOTAL KNEE ARTHROPLASTY (Left)   Up with therapy Discharge to SNF Follow up in 2 weeks at [facility]  Follow-up Information    Follow up with OLIN,Anylah Scheib D in 2 weeks.   Contact information:   [facility] [address] [place] [address] [phone]          Anastasio Auerbach. Kerby Borner   PAC  01/24/2012, 8:20 AM

## 2012-01-24 NOTE — Progress Notes (Signed)
Occupational Therapy Treatment Patient Details Name: [name] MRN: [id number] DOB: 06-21-1933 Today's Date: 01/24/2012 Time: 1308-6578 OT Time Calculation (min): 24 min  OT Assessment / Plan / Recommendation Comments on Treatment Session Pt doing very well overall supervision for selfcare tasks this am with use of RW for functional transfers.    Follow Up Recommendations  Skilled nursing facility    Barriers to Discharge       Equipment Recommendations  Defer to next venue    Recommendations for Other Services    Frequency Min 2X/week   Plan Discharge plan remains appropriate    Precautions / Restrictions Precautions Precautions: Knee   Pertinent Vitals/Pain 2/10 in the left knee.  Meds given earlier prior to session.    ADL  Grooming: Performed;Wash/dry hands;Teeth care;Brushing hair Where Assessed - Grooming: Unsupported standing Upper Body Dressing: Performed;Set up Where Assessed - Upper Body Dressing: Unsupported sitting Lower Body Dressing: Performed;Supervision/safety Where Assessed - Lower Body Dressing: Unsupported sit to stand Toilet Transfer: Performed;Supervision/safety Toilet Transfer Method: Stand pivot Toilet Transfer Equipment: Raised toilet seat with arms (or 3-in-1 over toilet) Toileting - Clothing Manipulation and Hygiene: Performed;Supervision/safety Where Assessed - Toileting Clothing Manipulation and Hygiene: Sit to stand from 3-in-1 or toilet Transfers/Ambulation Related to ADLs: Pt overall supervision for mobility using the RW. ADL Comments: Pt supervision level for all tasks.  Needed min instructional cues to start dressing the left LE first with pants and underpants.      OT Goals ADL Goals ADL Goal: Grooming - Progress: Met ADL Goal: Lower Body Dressing - Progress: Met ADL Goal: Toilet Transfer - Progress: Met ADL Goal: Toileting - Clothing Manipulation - Progress: Met  Visit Information  Last OT Received On: 01/24/12    Subjective  Data  Subjective: "I'm going to [facility] later today/" Patient Stated Goal: To get better and off of the walker.      Cognition  Overall Cognitive Status: Appears within functional limits for tasks assessed/performed Arousal/Alertness: Awake/alert Orientation Level: Appears intact for tasks assessed Behavior During Session: [facility] for tasks performed    Mobility Transfers Transfers: Sit to Stand Sit to Stand: 5: Supervision;With upper extremity assist;From bed;From toilet Stand to Sit: 5: Supervision;Without upper extremity assist;To chair/3-in-1      Balance Balance Balance Assessed: Yes Dynamic Standing Balance Dynamic Standing - Level of Assistance: 5: Stand by assistance  End of Session OT - End of Session Activity Tolerance: Patient tolerated treatment well Patient left: in chair;with call bell/phone within reach   [facility] OTR/L 01/24/2012, 9:02 AM  Pager number [phone]

## 2012-01-24 NOTE — Discharge Summary (Signed)
Physician Discharge Summary  Patient ID: [name] MRN: [id number] DOB/AGE: [birth year] 76 y.o.  Admit date: 01/21/2012 Discharge date: 01/24/2012  Procedures:  Procedure(s) (LRB): TOTAL KNEE ARTHROPLASTY (Left)  Attending Physician:  Dr. Durene Romans   Admission Diagnoses:   Left knee OA and pain   Discharge Diagnoses:  Principal Problem:  *S/P left TKA Hypertension   Heart murmur - mild, no cardiologist   GERD (gastroesophageal reflux disease)   Arthritis   HPI: Pt is a 78 y.o. female complaining of left knee pain for 3+ years. Pain had continually increased since the beginning. X-rays in the clinic show end-stage arthritic changes of the left knee. Pt has tried various conservative treatments which have failed to alleviate their symptoms, including NSAIDs and steroid injections. Various options are discussed with the patient. Risks, benefits and expectations were discussed with the patient. Patient understand the risks, benefits and expectations and wishes to proceed with surgery.  PCP: Lenora Boys, MD   Discharged Condition: good  Hospital Course:  Patient underwent the above stated procedure on 01/21/2012. Patient tolerated the procedure well and brought to the recovery room in good condition and subsequently to the floor.  POD #1 BP: 112/70 ; Pulse: 75 ; Temp: 98.1 F (36.7 C) ; Resp: 16  Pt's foley was removed, as well as the hemovac drain removed. IV was changed to a saline lock. Patient reports pain as mild, pain well controlled. No events throughout the night. Neurovascular intact, dorsiflexion/plantar flexion intact, incision: dressing C/D/I, no cellulitis present and compartment soft.   LABS  Basename  01/22/12 0430   HGB  11.1  HCT  33.9    POD #2  BP: 96/63 ; Pulse: 79 ; Temp: 97.8 F (36.6 C) ; Resp: 16  Patient reports pain as mild, pain well controlled. No events throughout the night. She is down to 1 L/min to sustained O2 sats. When she is taken off  of the O2 her sats drop, but she is not symptomatic at all. She admits that her husband was a 21 year smoker that died of lung CA in 01-20-02, which is probably effecting her O2 sats. Neurovascular intact, dorsiflexion/plantar flexion intact, incision: dressing C/D/I, no cellulitis present and compartment soft.   LABS  Basename  01/23/12 0504   HGB  10.2  HCT  31.2   POD #3  BP: 103/68 ; Pulse: 95 ; Temp: 97.9 F (36.6 C) ; Resp: 16  Patient reports pain as mild, well controlled with medication. No events throughout the night. She is driven to get better. Ready to be discharged. Neurovascular intact, dorsiflexion/plantar flexion intact, incision: dressing C/D/I, no cellulitis present and compartment soft.   LABS   No new labs  Discharge Exam: General appearance: alert, cooperative and no distress Extremities: Homans sign is negative, no sign of DVT, no edema, redness or tenderness in the calves or thighs and no ulcers, gangrene or trophic changes  Disposition:  SNF / Rehab  with follow up in 2 weeks     Discharge Orders    Future Orders Please Complete By Expires   Call MD / Call 911      Comments:   If you experience chest pain or shortness of breath, CALL 911 and be transported to the hospital emergency room.  If you develope a fever above 101 F, pus (white drainage) or increased drainage or redness at the wound, or calf pain, call your surgeon's office.   Discharge instructions  Comments:   Maintain surgical dressing for 8 days, then replace with gauze and tape. Keep the area dry and clean until follow up. Follow up in 2 weeks at [facility]. Call with any questions or concerns.   Constipation Prevention      Comments:   Drink plenty of fluids.  Prune juice may be helpful.  You may use a stool softener, such as Colace (over the counter) 100 mg twice a day.  Use MiraLax (over the counter) for constipation as needed.   Increase activity slowly as tolerated       Driving restrictions      Comments:   No driving for 4 weeks   TED hose      Comments:   Use stockings (TED hose) for 2 weeks on both leg(s).  You may remove them at night for sleeping.   Change dressing      Comments:   Maintain surgical dressing for 8 days, then change the dressing daily with sterile 4 x 4 inch gauze dressing and tape. Keep the area dry and clean.      Current Discharge Medication List    START taking these medications   Details  aspirin EC 325 MG tablet Take 1 tablet (325 mg total) by mouth 2 (two) times daily. X 4 weeks Qty: 60 tablet, Refills: 0    celecoxib (CELEBREX) 200 MG capsule Take 1 capsule (200 mg total) by mouth every 12 (twelve) hours. Qty: 60 capsule, Refills: 0    diphenhydrAMINE (BENADRYL) 25 mg capsule Take 1 capsule (25 mg total) by mouth every 6 (six) hours as needed for itching, allergies or sleep.    docusate sodium 100 MG CAPS Take 100 mg by mouth 2 (two) times daily.    ferrous sulfate 325 (65 FE) MG tablet Take 1 tablet (325 mg total) by mouth 3 (three) times daily after meals.    HYDROcodone-acetaminophen (NORCO) 7.5-325 MG per tablet Take 1-2 tablets by mouth every 4 (four) hours. Qty: 120 tablet, Refills: 0    methocarbamol (ROBAXIN) 500 MG tablet Take 1 tablet (500 mg total) by mouth every 6 (six) hours as needed (muscle spasms). Qty: 50 tablet, Refills: 0    polyethylene glycol (MIRALAX / GLYCOLAX) packet Take 17 g by mouth 2 (two) times daily.      CONTINUE these medications which have NOT CHANGED   Details  calcium-vitamin D (OSCAL WITH D) 500-200 MG-UNIT per tablet Take 1 tablet by mouth daily.    DULoxetine (CYMBALTA) 60 MG capsule Take 60 mg by mouth 2 (two) times daily.    losartan-hydrochlorothiazide (HYZAAR) 100-25 MG per tablet Take 1 tablet by mouth daily.    Multiple Vitamin (MULITIVITAMIN WITH MINERALS) TABS Take 1 tablet by mouth daily.    omeprazole (PRILOSEC) 20 MG capsule Take 20 mg by mouth every morning.      Vitamin D, Ergocalciferol, (DRISDOL) 50000 UNITS CAPS Take 50,000 Units by mouth every 7 (seven) days. Takes on Tuesdays.    zolpidem (AMBIEN) 10 MG tablet Take 10 mg by mouth at bedtime as needed. For insomnia.      STOP taking these medications     meloxicam (MOBIC) 15 MG tablet Comments:  Reason for Stopping:           Signed: Anastasio Auerbach. Gavina Dildine   PAC  01/24/2012, 8:25 AM

## 2012-01-24 NOTE — Progress Notes (Signed)
Physical Therapy Treatment Patient Details Name: TEYAH ROSSY MRN: [id number] DOB: Oct 18, 1932 Today's Date: 01/24/2012 Time: 4098-1191 PT Time Calculation (min): 27 min  PT Assessment / Plan / Recommendation Comments on Treatment Session  Pt very motivated and progressing well    Follow Up Recommendations  Skilled nursing facility    Barriers to Discharge        Equipment Recommendations  Defer to next venue    Recommendations for Other Services OT consult  Frequency 7X/week   Plan Discharge plan remains appropriate    Precautions / Restrictions Precautions Precautions: Knee Required Braces or Orthoses: Knee Immobilizer - Left Knee Immobilizer - Left: Discontinue once straight leg raise with < 10 degree lag Restrictions Weight Bearing Restrictions: No Other Position/Activity Restrictions: WBAT   Pertinent Vitals/Pain 4/10; premedicated and ice packs provided    Mobility  Transfers Transfers: Sit to Stand;Stand to Sit Sit to Stand: 5: Supervision;With upper extremity assist;From bed;From toilet Stand to Sit: 5: Supervision;Without upper extremity assist;To chair/3-in-1 Details for Transfer Assistance: min verbal cues for hand placement Ambulation/Gait Ambulation/Gait Assistance: 4: Min guard Ambulation Distance (Feet): 120 Feet Assistive device: Rolling walker Ambulation/Gait Assistance Details: min cues for posture and position from RW Gait Pattern: Step-to pattern    Exercises Total Joint Exercises Ankle Circles/Pumps: AROM;20 reps;Supine;Both Quad Sets: AROM;20 reps;Both;Supine Heel Slides: AAROM;20 reps;Left Straight Leg Raises: AAROM;AROM;20 reps;Left;Supine   PT Diagnosis:    PT Problem List:   PT Treatment Interventions:     PT Goals Acute Rehab PT Goals PT Goal Formulation: With patient Time For Goal Achievement: 01/28/12 Potential to Achieve Goals: Good Pt will go Supine/Side to Sit: with supervision PT Goal: Supine/Side to Sit - Progress:  Progressing toward goal Pt will go Sit to Supine/Side: with supervision PT Goal: Sit to Supine/Side - Progress: Progressing toward goal Pt will go Sit to Stand: with supervision PT Goal: Sit to Stand - Progress: Progressing toward goal Pt will go Stand to Sit: with supervision PT Goal: Stand to Sit - Progress: Progressing toward goal Pt will Ambulate: 51 - 150 feet;with supervision;with rolling walker PT Goal: Ambulate - Progress: Progressing toward goal  Visit Information  Last PT Received On: 01/24/12 Assistance Needed: +1    Subjective Data  Subjective: No new complaints Patient Stated Goal: Resume previous lifestyle with decreased pain   Cognition  Overall Cognitive Status: Appears within functional limits for tasks assessed/performed Arousal/Alertness: Awake/alert Orientation Level: Appears intact for tasks assessed Behavior During Session: [facility] for tasks performed    Balance  Balance Balance Assessed: Yes Dynamic Standing Balance Dynamic Standing - Level of Assistance: 5: Stand by assistance  End of Session PT - End of Session Activity Tolerance: Patient tolerated treatment well Patient left: in chair;with call bell/phone within reach Nurse Communication: Mobility status    Zoanne Newill 01/24/2012, 12:37 PM

## 2012-05-01 ENCOUNTER — Encounter (HOSPITAL_COMMUNITY): Payer: Self-pay | Admitting: Pharmacy Technician

## 2012-05-08 ENCOUNTER — Encounter (HOSPITAL_COMMUNITY): Payer: Self-pay

## 2012-05-08 ENCOUNTER — Encounter (HOSPITAL_COMMUNITY)
Admission: RE | Admit: 2012-05-08 | Discharge: 2012-05-08 | Disposition: A | Discharge status: Home / Self Care | Payer: Medicare Other | Source: Ambulatory Visit | Attending: Orthopedic Surgery | Admitting: Orthopedic Surgery

## 2012-05-08 HISTORY — DX: Malignant (primary) neoplasm, unspecified: C80.1

## 2012-05-08 HISTORY — DX: Other complications of anesthesia, initial encounter: T88.59XA

## 2012-05-08 HISTORY — DX: Adverse effect of unspecified anesthetic, initial encounter: T41.45XA

## 2012-05-08 LAB — URINALYSIS, ROUTINE W REFLEX MICROSCOPIC
Hgb urine dipstick: NEGATIVE
Nitrite: NEGATIVE
Protein, ur: NEGATIVE mg/dL
Specific Gravity, Urine: 1.019 (ref 1.005–1.030)
Urobilinogen, UA: 1 mg/dL (ref 0.0–1.0)

## 2012-05-08 LAB — SURGICAL PCR SCREEN
MRSA, PCR: NEGATIVE
Staphylococcus aureus: NEGATIVE

## 2012-05-08 LAB — BASIC METABOLIC PANEL
CO2: 29 mEq/L (ref 19–32)
Calcium: 10.3 mg/dL (ref 8.4–10.5)
Chloride: 101 mEq/L (ref 96–112)
Creatinine, Ser: 0.84 mg/dL (ref 0.50–1.10)
Glucose, Bld: 128 mg/dL — ABNORMAL HIGH (ref 70–99)
Sodium: 139 mEq/L (ref 135–145)

## 2012-05-08 LAB — URINE MICROSCOPIC-ADD ON

## 2012-05-08 LAB — CBC
HCT: 39.7 % (ref 36.0–46.0)
MCH: 26.6 pg (ref 26.0–34.0)
MCV: 81.2 fL (ref 78.0–100.0)
Platelets: 215 10*3/uL (ref 150–400)
RBC: 4.89 MIL/uL (ref 3.87–5.11)

## 2012-05-08 NOTE — H&P (Signed)
[name] is an 78 y.o. female.    Chief Complaint:  Right knee OA and pain   HPI:  Pt is a 78 y.o. female complaining of right knee pain for 3+ years. Pain had continually increased since the beginning.  She had a previous left total knee arthroplasty on 01/21/2012.  She has recovered well from this surgery and is looking forward to getting her right knee surgery. X-rays in the clinic show end-stage arthritic changes of the right knee. Pt has tried various conservative treatments which have failed to alleviate their symptoms, including NSAIDs and steroid injections. Various options are discussed with the patient. Risks, benefits and expectations were discussed with the patient. Patient understand the risks, benefits and expectations and wishes to proceed with surgery.   PCP:  Lenora Boys, MD  D/C Plans:  Home with HHPT  Post-op Meds:  Rx given for ASA, Robaxin, Celebrex, Iron, Colace and MiraLax  Tranexamic Acid:    To be given  Decadron:   To be given  PMH: Past Medical History  Diagnosis Date  . Hypertension   . GERD (gastroesophageal reflux disease)   . Arthritis   . Heart murmur     mild, no cardiologist  . Cancer     skin cancer moles removed 2013  . Complication of anesthesia     hyper ventilating and spinal did not work june 05-2102    PSH: Past Surgical History  Procedure Date  . Pre skin cancer areas removed from left middle finger and right  hand and right leg yrs ago  . Cesarean section 1957  . Tear duct surgery both eyes 18-20 yrs ago  . Left shoulder replacement 3-4- yrs ago  . Both eyes lens replacements for cataracts dec 2012   . Total knee arthroplasty 01/21/2012    Procedure: TOTAL KNEE ARTHROPLASTY;  Surgeon: [name], MD;  Location: [facility];  Service: Orthopedics;  Laterality: Left;  started as spinal, converted to general intraop.  . Cholecystectomy 1963 or 1964    Social History:  reports that she has never smoked. She has never used  smokeless tobacco. She reports that she does not drink alcohol or use illicit drugs.  Allergies:  No Known Allergies  Medications: No current facility-administered medications for this encounter.   Current Outpatient Prescriptions  Medication Sig Dispense Refill  . calcium-vitamin D (OSCAL WITH D) 500-200 MG-UNIT per tablet Take 1 tablet by mouth daily.      . DULoxetine (CYMBALTA) 60 MG capsule Take 60 mg by mouth 2 (two) times daily.      . ferrous sulfate 325 (65 FE) MG tablet Take 1 tablet (325 mg total) by mouth 3 (three) times daily after meals.      Marland Kitchen losartan-hydrochlorothiazide (HYZAAR) 100-25 MG per tablet Take 1 tablet by mouth daily with breakfast.       . meloxicam (MOBIC) 15 MG tablet Take 15 mg by mouth daily.      . Multiple Vitamin (MULITIVITAMIN WITH MINERALS) TABS Take 1 tablet by mouth daily.      Marland Kitchen omeprazole (PRILOSEC) 20 MG capsule Take 20 mg by mouth every morning.       . Vitamin D, Ergocalciferol, (DRISDOL) 50000 UNITS CAPS Take 50,000 Units by mouth every 7 (seven) days. Takes on Tuesdays.      Marland Kitchen zolpidem (AMBIEN) 10 MG tablet Take 10 mg by mouth at bedtime as needed. For insomnia.        Results for  orders placed during the hospital encounter of 05/08/12 (from the past 48 hour(s))  URINALYSIS, ROUTINE W REFLEX MICROSCOPIC     Status: Abnormal   Collection Time   05/08/12 10:50 AM      Component Value Range Comment   Color, Urine YELLOW  YELLOW    APPearance CLEAR  CLEAR    Specific Gravity, Urine 1.019  1.005 - 1.030    pH 7.0  5.0 - 8.0    Glucose, UA NEGATIVE  NEGATIVE mg/dL    Hgb urine dipstick NEGATIVE  NEGATIVE    Bilirubin Urine NEGATIVE  NEGATIVE    Ketones, ur NEGATIVE  NEGATIVE mg/dL    Protein, ur NEGATIVE  NEGATIVE mg/dL    Urobilinogen, UA 1.0  0.0 - 1.0 mg/dL    Nitrite NEGATIVE  NEGATIVE    Leukocytes, UA SMALL (*) NEGATIVE   URINE MICROSCOPIC-ADD ON     Status: Abnormal   Collection Time   05/08/12 10:50 AM      Component Value Range  Comment   Squamous Epithelial / LPF RARE  RARE    WBC, UA 3-6  <3 WBC/hpf    Bacteria, UA FEW (*) RARE    Urine-Other MUCOUS PRESENT     APTT     Status: Normal   Collection Time   05/08/12 11:15 AM      Component Value Range Comment   aPTT 31  24 - 37 seconds   BASIC METABOLIC PANEL     Status: Abnormal   Collection Time   05/08/12 11:15 AM      Component Value Range Comment   Sodium 139  135 - 145 mEq/L    Potassium 4.0  3.5 - 5.1 mEq/L    Chloride 101  96 - 112 mEq/L    CO2 29  19 - 32 mEq/L    Glucose, Bld 128 (*) 70 - 99 mg/dL    BUN 19  6 - 23 mg/dL    Creatinine, Ser 4.09  0.50 - 1.10 mg/dL    Calcium 81.1  8.4 - 10.5 mg/dL    GFR calc non Af Amer 65 (*) >90 mL/min    GFR calc Af Amer 76 (*) >90 mL/min   CBC     Status: Normal   Collection Time   05/08/12 11:15 AM      Component Value Range Comment   WBC 6.9  4.0 - 10.5 K/uL    RBC 4.89  3.87 - 5.11 MIL/uL    Hemoglobin 13.0  12.0 - 15.0 g/dL    HCT 91.4  78.2 - 95.6 %    MCV 81.2  78.0 - 100.0 fL    MCH 26.6  26.0 - 34.0 pg    MCHC 32.7  30.0 - 36.0 g/dL    RDW 21.3  08.6 - 57.8 %    Platelets 215  150 - 400 K/uL   PROTIME-INR     Status: Normal   Collection Time   05/08/12 11:15 AM      Component Value Range Comment   Prothrombin Time 12.6  11.6 - 15.2 seconds    INR 0.95  0.00 - 1.49   TYPE AND SCREEN     Status: Normal   Collection Time   05/08/12 11:15 AM      Component Value Range Comment   ABO/RH(D) O POS      Antibody Screen NEG      Sample Expiration 05/22/2012     SURGICAL PCR SCREEN  Status: Normal   Collection Time   05/08/12 11:15 AM      Component Value Range Comment   MRSA, PCR NEGATIVE  NEGATIVE    Staphylococcus aureus NEGATIVE  NEGATIVE     ROS: Review of Systems  Constitutional: Negative.   HENT: Negative.   Eyes: Negative.   Respiratory: Negative.   Cardiovascular: Negative.   Gastrointestinal: Negative.   Genitourinary: Negative.   Musculoskeletal: Positive for myalgias, back  pain and joint pain.  Skin: Negative.   Neurological: Negative.   Endo/Heme/Allergies: Negative.   Psychiatric/Behavioral: Negative.      Physical Exam: BP:   138/71  ;  HR:   86  ; Resp:   16  ; Physical Exam  Constitutional: She is oriented to person, place, and time and well-developed, well-nourished, and in no distress.  HENT:  Head: Normocephalic and atraumatic.  Nose: Nose normal.  Mouth/Throat: Oropharynx is clear and moist.  Eyes: Pupils are equal, round, and reactive to light.  Neck: Neck supple. No JVD present. No tracheal deviation present. No thyromegaly present.  Cardiovascular: Normal rate, regular rhythm and intact distal pulses.   Murmur heard. Pulmonary/Chest: Effort normal and breath sounds normal. No stridor. No respiratory distress. She has no wheezes.  Abdominal: Soft. There is no tenderness. There is no guarding.  Musculoskeletal:       Right knee: She exhibits swelling, deformity (crepitus and pain with motion) and bony tenderness. She exhibits normal range of motion, no effusion, no ecchymosis, no laceration and no erythema. tenderness found.  Lymphadenopathy:    She has no cervical adenopathy.  Neurological: She is alert and oriented to person, place, and time.  Skin: Skin is warm and dry.  Psychiatric: Affect normal.      Assessment/Plan Assessment:  Right knee OA and pain   Plan: Patient will undergo a right total knee arthroplasty on 05/12/2012 per Dr. Charlann Boxer at [facility]. Risks benefits and expectations were discussed with the patient. Patient understand risks, benefits and expectations and wishes to proceed.   Anastasio Auerbach Cortavius Montesinos   PAC  05/08/2012, 3:40 PM

## 2012-05-08 NOTE — Progress Notes (Signed)
Micro, ua results faxed by epic to dr Charlann Boxer office

## 2012-05-08 NOTE — Patient Instructions (Addendum)
20 [name]  05/08/2012   Your procedure is scheduled on:  05-12-2012  Report to [facility] at  710 AM.  Call this number if you have problems the morning of surgery: [phone]   Remember:   Do not eat food or drink liquids:After Midnight.  .  Take these medicines the morning of surgery with A SIP OF WATER: omeprazole, cymbalta   Do not wear jewelry or make up.  Do not wear lotions, powders, or perfumes. You may wear deodorant.    Do not bring valuables to the hospital.  Contacts, dentures or bridgework may not be worn into surgery.  Leave suitcase in the car. After surgery it may be brought to your room.  For patients admitted to the hospital, checkout time is 11:00 AM the day of discharge                             Patients discharged the day of surgery will not be allowed to drive home. If going home same day of surgery, you must have someone stay with you the first 24 hours at home and arrange for some one to drive you home from hospital.    Special Instructions: See [facility] Preparing for Surgery instruction sheet. Women do not shave legs or underarms for 12 hours before showers. Men may shave face morning of surgery.    Please read over the following fact sheets that you were given: MRSA Information, blood fact sheet  [facility] pre op nurse phone number [phone], call if needed

## 2012-05-12 ENCOUNTER — Encounter (HOSPITAL_COMMUNITY)
Admission: RE | Disposition: A | Discharge status: Home Health | Payer: Self-pay | Source: Ambulatory Visit | Attending: Orthopedic Surgery

## 2012-05-12 ENCOUNTER — Inpatient Hospital Stay (HOSPITAL_COMMUNITY)
Admission: RE | Admit: 2012-05-12 | Discharge: 2012-05-14 | DRG: 470 | Disposition: A | Discharge status: Home Health | Payer: Medicare Other | Source: Ambulatory Visit | Attending: Orthopedic Surgery | Admitting: Orthopedic Surgery

## 2012-05-12 ENCOUNTER — Encounter (HOSPITAL_COMMUNITY): Payer: Self-pay | Admitting: *Deleted

## 2012-05-12 ENCOUNTER — Encounter (HOSPITAL_COMMUNITY): Payer: Self-pay | Admitting: Anesthesiology

## 2012-05-12 ENCOUNTER — Inpatient Hospital Stay (HOSPITAL_COMMUNITY): Payer: Medicare Other | Admitting: Anesthesiology

## 2012-05-12 DIAGNOSIS — Z96619 Presence of unspecified artificial shoulder joint: Secondary | ICD-10-CM

## 2012-05-12 DIAGNOSIS — Z6836 Body mass index (BMI) 36.0-36.9, adult: Secondary | ICD-10-CM

## 2012-05-12 DIAGNOSIS — M1710 Unilateral primary osteoarthritis, unspecified knee: Principal | ICD-10-CM | POA: Diagnosis present

## 2012-05-12 DIAGNOSIS — I10 Essential (primary) hypertension: Secondary | ICD-10-CM | POA: Diagnosis present

## 2012-05-12 DIAGNOSIS — Z79899 Other long term (current) drug therapy: Secondary | ICD-10-CM

## 2012-05-12 DIAGNOSIS — Z96659 Presence of unspecified artificial knee joint: Secondary | ICD-10-CM

## 2012-05-12 DIAGNOSIS — E669 Obesity, unspecified: Secondary | ICD-10-CM

## 2012-05-12 DIAGNOSIS — K219 Gastro-esophageal reflux disease without esophagitis: Secondary | ICD-10-CM | POA: Diagnosis present

## 2012-05-12 DIAGNOSIS — D500 Iron deficiency anemia secondary to blood loss (chronic): Secondary | ICD-10-CM

## 2012-05-12 DIAGNOSIS — Z9089 Acquired absence of other organs: Secondary | ICD-10-CM

## 2012-05-12 DIAGNOSIS — Z85828 Personal history of other malignant neoplasm of skin: Secondary | ICD-10-CM

## 2012-05-12 DIAGNOSIS — E871 Hypo-osmolality and hyponatremia: Secondary | ICD-10-CM

## 2012-05-12 DIAGNOSIS — D62 Acute posthemorrhagic anemia: Secondary | ICD-10-CM | POA: Diagnosis not present

## 2012-05-12 HISTORY — PX: TOTAL KNEE ARTHROPLASTY: SHX125

## 2012-05-12 LAB — TYPE AND SCREEN
ABO/RH(D): O POS
Antibody Screen: NEGATIVE

## 2012-05-12 SURGERY — ARTHROPLASTY, KNEE, TOTAL
Anesthesia: Spinal | Site: Knee | Laterality: Right | Wound class: Clean

## 2012-05-12 MED ORDER — BUPIVACAINE-EPINEPHRINE PF 0.25-1:200000 % IJ SOLN
INTRAMUSCULAR | Status: DC | PRN
  Administered 2012-05-12: 50 mL

## 2012-05-12 MED ORDER — ONDANSETRON HCL 4 MG/2ML IJ SOLN
INTRAMUSCULAR | Status: DC | PRN
  Administered 2012-05-12: 4 mg via INTRAVENOUS

## 2012-05-12 MED ORDER — ACETAMINOPHEN 10 MG/ML IV SOLN
INTRAVENOUS | Status: DC | PRN
  Administered 2012-05-12: 1000 mg via INTRAVENOUS

## 2012-05-12 MED ORDER — PROPOFOL 10 MG/ML IV BOLUS
INTRAVENOUS | Status: DC | PRN
  Administered 2012-05-12: 150 mg via INTRAVENOUS

## 2012-05-12 MED ORDER — ONDANSETRON HCL 4 MG/2ML IJ SOLN: 4.0000 mg | Freq: Four times a day (QID) | INTRAMUSCULAR | Status: DC | PRN

## 2012-05-12 MED ORDER — DIPHENHYDRAMINE HCL 25 MG PO CAPS: 25.0000 mg | ORAL_CAPSULE | Freq: Four times a day (QID) | ORAL | Status: DC | PRN

## 2012-05-12 MED ORDER — FENTANYL CITRATE 0.05 MG/ML IJ SOLN
INTRAMUSCULAR | Status: DC | PRN
  Administered 2012-05-12 (×7): 50 ug via INTRAVENOUS

## 2012-05-12 MED ORDER — HYDROMORPHONE HCL PF 1 MG/ML IJ SOLN: 0.5000 mg | INTRAMUSCULAR | Status: DC | PRN

## 2012-05-12 MED ORDER — LOSARTAN POTASSIUM 50 MG PO TABS
100.0000 mg | ORAL_TABLET | Freq: Every day | ORAL | Status: DC
  Administered 2012-05-12 – 2012-05-14 (×3): 100 mg via ORAL
  Filled 2012-05-12 (×3): qty 2

## 2012-05-12 MED ORDER — BISACODYL 10 MG RE SUPP: 10.0000 mg | Freq: Every day | RECTAL | Status: DC | PRN

## 2012-05-12 MED ORDER — BUPIVACAINE HCL 0.75 % IJ SOLN
INTRAMUSCULAR | Status: DC | PRN
  Administered 2012-05-12: 12 mg via INTRATHECAL

## 2012-05-12 MED ORDER — MENTHOL 3 MG MT LOZG
1.0000 | LOZENGE | OROMUCOSAL | Status: DC | PRN
  Filled 2012-05-12: qty 9

## 2012-05-12 MED ORDER — PHENOL 1.4 % MT LIQD
1.0000 | OROMUCOSAL | Status: DC | PRN
  Filled 2012-05-12: qty 177

## 2012-05-12 MED ORDER — METHOCARBAMOL 100 MG/ML IJ SOLN
500.0000 mg | Freq: Four times a day (QID) | INTRAMUSCULAR | Status: DC | PRN
  Administered 2012-05-12: 500 mg via INTRAVENOUS
  Filled 2012-05-12: qty 5

## 2012-05-12 MED ORDER — CEFAZOLIN SODIUM-DEXTROSE 2-3 GM-% IV SOLR
2.0000 g | Freq: Four times a day (QID) | INTRAVENOUS | Status: AC
  Administered 2012-05-12 (×2): 2 g via INTRAVENOUS
  Filled 2012-05-12 (×2): qty 50

## 2012-05-12 MED ORDER — HYDROMORPHONE HCL PF 1 MG/ML IJ SOLN
0.2500 mg | INTRAMUSCULAR | Status: DC | PRN
  Administered 2012-05-12 (×3): 0.5 mg via INTRAVENOUS

## 2012-05-12 MED ORDER — DEXAMETHASONE SODIUM PHOSPHATE 10 MG/ML IJ SOLN
10.0000 mg | Freq: Once | INTRAMUSCULAR | Status: AC
  Administered 2012-05-13: 10 mg via INTRAVENOUS
  Filled 2012-05-12: qty 1

## 2012-05-12 MED ORDER — HYDROCHLOROTHIAZIDE 25 MG PO TABS
25.0000 mg | ORAL_TABLET | Freq: Every day | ORAL | Status: DC
  Administered 2012-05-12 – 2012-05-14 (×3): 25 mg via ORAL
  Filled 2012-05-12 (×3): qty 1

## 2012-05-12 MED ORDER — 0.9 % SODIUM CHLORIDE (POUR BTL) OPTIME
TOPICAL | Status: DC | PRN
  Administered 2012-05-12: 1000 mL

## 2012-05-12 MED ORDER — LOSARTAN POTASSIUM-HCTZ 100-25 MG PO TABS: 1.0000 | ORAL_TABLET | Freq: Every day | ORAL | Status: DC

## 2012-05-12 MED ORDER — HYDROCODONE-ACETAMINOPHEN 7.5-325 MG PO TABS
1.0000 | ORAL_TABLET | ORAL | Status: DC
  Administered 2012-05-12 – 2012-05-14 (×9): 1 via ORAL
  Filled 2012-05-12 (×9): qty 1

## 2012-05-12 MED ORDER — RIVAROXABAN 10 MG PO TABS
10.0000 mg | ORAL_TABLET | ORAL | Status: DC
  Administered 2012-05-13 – 2012-05-14 (×2): 10 mg via ORAL
  Filled 2012-05-12 (×3): qty 1

## 2012-05-12 MED ORDER — OXYCODONE HCL 5 MG/5ML PO SOLN
5.0000 mg | Freq: Once | ORAL | Status: DC | PRN
  Filled 2012-05-12: qty 5

## 2012-05-12 MED ORDER — ONDANSETRON HCL 4 MG PO TABS: 4.0000 mg | ORAL_TABLET | Freq: Four times a day (QID) | ORAL | Status: DC | PRN

## 2012-05-12 MED ORDER — METHOCARBAMOL 500 MG PO TABS
500.0000 mg | ORAL_TABLET | Freq: Four times a day (QID) | ORAL | Status: DC | PRN
  Administered 2012-05-13 – 2012-05-14 (×4): 500 mg via ORAL
  Filled 2012-05-12 (×4): qty 1

## 2012-05-12 MED ORDER — POLYETHYLENE GLYCOL 3350 17 G PO PACK
17.0000 g | PACK | Freq: Two times a day (BID) | ORAL | Status: DC
  Administered 2012-05-12 – 2012-05-14 (×4): 17 g via ORAL

## 2012-05-12 MED ORDER — LACTATED RINGERS IV SOLN
INTRAVENOUS | Status: DC | PRN
  Administered 2012-05-12 (×2): via INTRAVENOUS

## 2012-05-12 MED ORDER — KETOROLAC TROMETHAMINE 30 MG/ML IJ SOLN
INTRAMUSCULAR | Status: DC | PRN
  Administered 2012-05-12: 30 mg

## 2012-05-12 MED ORDER — LACTATED RINGERS IV SOLN: INTRAVENOUS | Status: DC

## 2012-05-12 MED ORDER — TRANEXAMIC ACID 100 MG/ML IV SOLN
1280.0000 mg | Freq: Once | INTRAVENOUS | Status: AC
  Administered 2012-05-12: 1280 mg via INTRAVENOUS
  Filled 2012-05-12: qty 12.8

## 2012-05-12 MED ORDER — CELECOXIB 200 MG PO CAPS
200.0000 mg | ORAL_CAPSULE | Freq: Two times a day (BID) | ORAL | Status: DC
  Administered 2012-05-12 – 2012-05-14 (×4): 200 mg via ORAL
  Filled 2012-05-12 (×5): qty 1

## 2012-05-12 MED ORDER — FERROUS SULFATE 325 (65 FE) MG PO TABS
325.0000 mg | ORAL_TABLET | Freq: Three times a day (TID) | ORAL | Status: DC
  Administered 2012-05-12 – 2012-05-14 (×6): 325 mg via ORAL
  Filled 2012-05-12 (×8): qty 1

## 2012-05-12 MED ORDER — DULOXETINE HCL 60 MG PO CPEP
60.0000 mg | ORAL_CAPSULE | Freq: Two times a day (BID) | ORAL | Status: DC
  Administered 2012-05-12 – 2012-05-14 (×4): 60 mg via ORAL
  Filled 2012-05-12 (×5): qty 1

## 2012-05-12 MED ORDER — DEXAMETHASONE SODIUM PHOSPHATE 10 MG/ML IJ SOLN
10.0000 mg | Freq: Once | INTRAMUSCULAR | Status: AC
  Administered 2012-05-12: 10 mg via INTRAVENOUS
  Filled 2012-05-12: qty 1

## 2012-05-12 MED ORDER — ZOLPIDEM TARTRATE 5 MG PO TABS
5.0000 mg | ORAL_TABLET | Freq: Every evening | ORAL | Status: DC | PRN
  Filled 2012-05-12: qty 1

## 2012-05-12 MED ORDER — ACETAMINOPHEN 10 MG/ML IV SOLN: 1000.0000 mg | Freq: Once | INTRAVENOUS | Status: DC | PRN

## 2012-05-12 MED ORDER — DOCUSATE SODIUM 100 MG PO CAPS
100.0000 mg | ORAL_CAPSULE | Freq: Two times a day (BID) | ORAL | Status: DC
  Administered 2012-05-12 – 2012-05-14 (×4): 100 mg via ORAL

## 2012-05-12 MED ORDER — OXYCODONE HCL 5 MG PO TABS
5.0000 mg | ORAL_TABLET | Freq: Once | ORAL | Status: DC | PRN
Start: 2012-05-12 — End: 2012-05-12

## 2012-05-12 MED ORDER — FLEET ENEMA 7-19 GM/118ML RE ENEM: 1.0000 | ENEMA | Freq: Once | RECTAL | Status: AC | PRN

## 2012-05-12 MED ORDER — SODIUM CHLORIDE 0.9 % IV SOLN
INTRAVENOUS | Status: AC
  Administered 2012-05-12 – 2012-05-13 (×2): via INTRAVENOUS
  Filled 2012-05-12 (×4): qty 1000

## 2012-05-12 MED ORDER — PROPOFOL INFUSION 10 MG/ML OPTIME
INTRAVENOUS | Status: DC | PRN
  Administered 2012-05-12: 50 ug/kg/min via INTRAVENOUS

## 2012-05-12 MED ORDER — ALUM & MAG HYDROXIDE-SIMETH 200-200-20 MG/5ML PO SUSP: 30.0000 mL | ORAL | Status: DC | PRN

## 2012-05-12 MED ORDER — CEFAZOLIN SODIUM-DEXTROSE 2-3 GM-% IV SOLR
2.0000 g | INTRAVENOUS | Status: AC
  Administered 2012-05-12: 2 g via INTRAVENOUS

## 2012-05-12 MED ORDER — LACTATED RINGERS IV SOLN
INTRAVENOUS | Status: DC
  Administered 2012-05-12: 1000 mL via INTRAVENOUS

## 2012-05-12 MED ORDER — MEPERIDINE HCL 50 MG/ML IJ SOLN: 6.2500 mg | INTRAMUSCULAR | Status: DC | PRN

## 2012-05-12 MED ORDER — PROMETHAZINE HCL 25 MG/ML IJ SOLN: 6.2500 mg | INTRAMUSCULAR | Status: DC | PRN

## 2012-05-12 MED ORDER — PANTOPRAZOLE SODIUM 40 MG PO TBEC
40.0000 mg | DELAYED_RELEASE_TABLET | Freq: Every day | ORAL | Status: DC
  Administered 2012-05-13 – 2012-05-14 (×2): 40 mg via ORAL
  Filled 2012-05-12 (×2): qty 1

## 2012-05-12 SURGICAL SUPPLY — 62 items
ADH SKN CLS APL DERMABOND .7 (GAUZE/BANDAGES/DRESSINGS) ×1
BAG SPEC THK2 15X12 ZIP CLS (MISCELLANEOUS) ×1
BAG ZIPLOCK 12X15 (MISCELLANEOUS) ×2 IMPLANT
BANDAGE ELASTIC 6 VELCRO ST LF (GAUZE/BANDAGES/DRESSINGS) ×2 IMPLANT
BANDAGE ESMARK 6X9 LF (GAUZE/BANDAGES/DRESSINGS) ×1 IMPLANT
BLADE SAW SGTL 13.0X1.19X90.0M (BLADE) ×2 IMPLANT
BNDG CMPR 9X6 STRL LF SNTH (GAUZE/BANDAGES/DRESSINGS) ×1
BNDG ESMARK 6X9 LF (GAUZE/BANDAGES/DRESSINGS) ×2
BOWL SMART MIX CTS (DISPOSABLE) ×2 IMPLANT
CEMENT HV SMART SET (Cement) ×2 IMPLANT
CLOTH BEACON ORANGE TIMEOUT ST (SAFETY) ×2 IMPLANT
CUFF TOURN SGL QUICK 34 (TOURNIQUET CUFF) ×2
CUFF TRNQT CYL 34X4X40X1 (TOURNIQUET CUFF) ×1 IMPLANT
DECANTER SPIKE VIAL GLASS SM (MISCELLANEOUS) ×2 IMPLANT
DERMABOND ADVANCED (GAUZE/BANDAGES/DRESSINGS) ×1
DERMABOND ADVANCED .7 DNX12 (GAUZE/BANDAGES/DRESSINGS) ×1 IMPLANT
DRAPE EXTREMITY T 121X128X90 (DRAPE) ×2 IMPLANT
DRAPE POUCH INSTRU U-SHP 10X18 (DRAPES) ×2 IMPLANT
DRAPE U-SHAPE 47X51 STRL (DRAPES) ×2 IMPLANT
DRSG AQUACEL AG ADV 3.5X10 (GAUZE/BANDAGES/DRESSINGS) ×2 IMPLANT
DRSG TEGADERM 4X4.75 (GAUZE/BANDAGES/DRESSINGS) ×2 IMPLANT
DURAPREP 26ML APPLICATOR (WOUND CARE) ×2 IMPLANT
ELECT REM PT RETURN 9FT ADLT (ELECTROSURGICAL) ×2
ELECTRODE REM PT RTRN 9FT ADLT (ELECTROSURGICAL) ×1 IMPLANT
EVACUATOR 1/8 PVC DRAIN (DRAIN) ×2 IMPLANT
FACESHIELD LNG OPTICON STERILE (SAFETY) ×10 IMPLANT
GAUZE SPONGE 2X2 8PLY STRL LF (GAUZE/BANDAGES/DRESSINGS) ×1 IMPLANT
GLOVE BIOGEL PI IND STRL 7.5 (GLOVE) ×1 IMPLANT
GLOVE BIOGEL PI IND STRL 8 (GLOVE) ×1 IMPLANT
GLOVE BIOGEL PI INDICATOR 7.5 (GLOVE) ×1
GLOVE BIOGEL PI INDICATOR 8 (GLOVE) ×1
GLOVE ECLIPSE 8.0 STRL XLNG CF (GLOVE) ×2 IMPLANT
GLOVE ORTHO TXT STRL SZ7.5 (GLOVE) ×4 IMPLANT
GLOVE SURG SS PI 6.5 STRL IVOR (GLOVE) ×4 IMPLANT
GOWN BRE IMP PREV XXLGXLNG (GOWN DISPOSABLE) ×3 IMPLANT
GOWN BRE IMP SLV SIRUS LXLNG (GOWN DISPOSABLE) ×1 IMPLANT
GOWN STRL NON-REIN LRG LVL3 (GOWN DISPOSABLE) ×2 IMPLANT
GOWN STRL REIN XL XLG (GOWN DISPOSABLE) ×1 IMPLANT
HANDPIECE INTERPULSE COAX TIP (DISPOSABLE) ×2
IMMOBILIZER KNEE 20 (SOFTGOODS) ×2
IMMOBILIZER KNEE 20 THIGH 36 (SOFTGOODS) IMPLANT
KIT BASIN OR (CUSTOM PROCEDURE TRAY) ×2 IMPLANT
MANIFOLD NEPTUNE II (INSTRUMENTS) ×2 IMPLANT
NDL SAFETY ECLIPSE 18X1.5 (NEEDLE) ×1 IMPLANT
NEEDLE HYPO 18GX1.5 SHARP (NEEDLE) ×2
NS IRRIG 1000ML POUR BTL (IV SOLUTION) ×3 IMPLANT
PACK TOTAL JOINT (CUSTOM PROCEDURE TRAY) ×2 IMPLANT
POSITIONER SURGICAL ARM (MISCELLANEOUS) ×2 IMPLANT
SET HNDPC FAN SPRY TIP SCT (DISPOSABLE) ×1 IMPLANT
SET PAD KNEE POSITIONER (MISCELLANEOUS) ×2 IMPLANT
SPONGE GAUZE 2X2 STER 10/PKG (GAUZE/BANDAGES/DRESSINGS) ×1
SUCTION FRAZIER 12FR DISP (SUCTIONS) ×2 IMPLANT
SUT MNCRL AB 4-0 PS2 18 (SUTURE) ×2 IMPLANT
SUT VIC AB 1 CT1 36 (SUTURE) ×5 IMPLANT
SUT VIC AB 2-0 CT1 27 (SUTURE) ×4
SUT VIC AB 2-0 CT1 TAPERPNT 27 (SUTURE) ×3 IMPLANT
SUT VLOC 180 0 24IN GS25 (SUTURE) ×1 IMPLANT
SYR 50ML LL SCALE MARK (SYRINGE) ×2 IMPLANT
TOWEL OR 17X26 10 PK STRL BLUE (TOWEL DISPOSABLE) ×4 IMPLANT
TRAY FOLEY CATH 14FRSI W/METER (CATHETERS) ×2 IMPLANT
WATER STERILE IRR 1500ML POUR (IV SOLUTION) ×3 IMPLANT
WRAP KNEE MAXI GEL POST OP (GAUZE/BANDAGES/DRESSINGS) ×2 IMPLANT

## 2012-05-12 NOTE — Progress Notes (Signed)
Utilization review completed.  

## 2012-05-12 NOTE — Anesthesia Preprocedure Evaluation (Addendum)
Anesthesia Evaluation  Patient identified by MRN, date of birth, ID band Patient awake    Reviewed: Allergy & Precautions, H&P , NPO status , Patient's Chart, lab work & pertinent test results  History of Anesthesia Complications Negative for: history of anesthetic complications  Airway Mallampati: II TM Distance: >3 FB Neck ROM: Full    Dental No notable dental hx. (+) Edentulous Upper, Edentulous Lower and Dental Advisory Given   Pulmonary neg pulmonary ROS,  breath sounds clear to auscultation  Pulmonary exam normal       Cardiovascular hypertension, Pt. on medications negative cardio ROS  + Valvular Problems/Murmurs Rhythm:Regular Rate:Normal  Mild SEM, no echo   Neuro/Psych negative neurological ROS  negative psych ROS   GI/Hepatic negative GI ROS, Neg liver ROS, GERD-  Medicated and Controlled,  Endo/Other  negative endocrine ROS  Renal/GU negative Renal ROS  negative genitourinary   Musculoskeletal negative musculoskeletal ROS (+)   Abdominal   Peds negative pediatric ROS (+)  Hematology negative hematology ROS (+)   Anesthesia Other Findings   Reproductive/Obstetrics negative OB ROS                         Anesthesia Physical  Anesthesia Plan  ASA: II  Anesthesia Plan: Spinal   Post-op Pain Management:    Induction:   Airway Management Planned: Simple Face Mask  Additional Equipment:   Intra-op Plan:   Post-operative Plan:   Informed Consent: I have reviewed the patients History and Physical, chart, labs and discussed the procedure including the risks, benefits and alternatives for the proposed anesthesia with the patient or authorized representative who has indicated his/her understanding and acceptance.   Dental advisory given  Plan Discussed with: CRNA  Anesthesia Plan Comments:        Anesthesia Quick Evaluation

## 2012-05-12 NOTE — Anesthesia Postprocedure Evaluation (Addendum)
Anesthesia Post Note  Patient: [name]  Procedure(s) Performed: Procedure(s) (LRB): TOTAL KNEE ARTHROPLASTY (Right)  Anesthesia type: Spinal converted to general  Patient location: PACU  Post pain: Pain level controlled  Post assessment: Post-op Vital signs reviewed  Last Vitals: BP 141/82  Pulse 97  Temp 36.6 C (Oral)  Resp 14  SpO2 97%  Post vital signs: Reviewed  Level of consciousness: sedated  Complications: Inadequate anesthesia with incision. Converted to general. Pain well controlled and spinal clearly working in PACU.  No other apparent anesthesia complications  

## 2012-05-12 NOTE — Anesthesia Postprocedure Evaluation (Deleted)
Anesthesia Post Note  Patient: [name]  Procedure(s) Performed: Procedure(s) (LRB): TOTAL KNEE ARTHROPLASTY (Right)  Anesthesia type: Spinal converted to general  Patient location: PACU  Post pain: Pain level controlled  Post assessment: Post-op Vital signs reviewed  Last Vitals: BP 141/82  Pulse 97  Temp 36.6 C (Oral)  Resp 14  SpO2 97%  Post vital signs: Reviewed  Level of consciousness: sedated  Complications: Inadequate anesthesia with incision. Converted to general. Pain well controlled and spinal clearly working in PACU.  No other apparent anesthesia complications

## 2012-05-12 NOTE — Transfer of Care (Signed)
Immediate Anesthesia Transfer of Care Note  Patient: [name]  Procedure(s) Performed: Procedure(s) (LRB): TOTAL KNEE ARTHROPLASTY (Right)  Patient Location: PACU  Anesthesia Type: General and Regional  Level of Consciousness: sedated, patient cooperative and responds to stimulaton  Airway & Oxygen Therapy: Patient Spontanous Breathing and Patient connected to face mask oxgen  Post-op Assessment: Report given to PACU RN and Post -op Vital signs reviewed and stable  Post vital signs: Reviewed and stable  Complications: No apparent anesthesia complications Pt able to move lower ext, unable to lift legs off bed.  Denied pain on evaluation.

## 2012-05-12 NOTE — Interval H&P Note (Signed)
History and Physical Interval Note:  05/12/2012 9:21 AM  [name]  has presented today for surgery, with the diagnosis of Osteoarthritis of the Right Knee  The various methods of treatment have been discussed with the patient and family. After consideration of risks, benefits and other options for treatment, the patient has consented to  Procedure(s) (LRB) with comments: TOTAL KNEE ARTHROPLASTY (Right) as a surgical intervention .  The patient's history has been reviewed, patient examined, no change in status, stable for surgery.  I have reviewed the patient's chart and labs.  Questions were answered to the patient's satisfaction.     [name]

## 2012-05-12 NOTE — Anesthesia Procedure Notes (Signed)
Spinal  Patient location during procedure: OR Start time: 05/12/2012 10:18 AM End time: 05/12/2012 10:27 AM Staffing Anesthesiologist: Lewie Loron R Performed by: anesthesiologist  Preanesthetic Checklist Completed: patient identified, site marked, surgical consent, pre-op evaluation, timeout performed, IV checked, risks and benefits discussed and monitors and equipment checked Spinal Block Patient position: sitting Prep: ChloraPrep Patient monitoring: heart rate, continuous pulse ox and blood pressure Approach: midline Location: L4-5 Injection technique: single-shot Needle Needle type: Sprotte and Quincke  Needle gauge: 22 G Needle length: 9 cm Assessment Sensory level: T8 Additional Notes Expiration date of kit checked and confirmed. Patient tolerated procedure well, without complications.

## 2012-05-12 NOTE — Op Note (Signed)
NAME:  [name]                      MEDICAL RECORD NO.:  [id number]                             FACILITY:  [facility]      PHYSICIAN:  Madlyn Frankel. Charlann Boxer, M.D.  DATE OF BIRTH:  Mar 08, 1933      DATE OF PROCEDURE:  05/12/2012                                     OPERATIVE REPORT         PREOPERATIVE DIAGNOSIS:  Right knee osteoarthritis.      POSTOPERATIVE DIAGNOSIS:  Right knee osteoarthritis.      FINDINGS:  The patient was noted to have complete loss of cartilage and   bone-on-bone arthritis with associated osteophytes in the lateral and patellofemoral compartments of   the knee with a significant synovitis and associated effusion.      PROCEDURE:  Right total knee replacement.      COMPONENTS USED:  DePuy rotating platform posterior stabilized knee   system, a size 2.5 femur, 2.5 tibia, 12.5 mm insert, and 38 patellar   button.      SURGEON:  Madlyn Frankel. Charlann Boxer, M.D.      ASSISTANT:  Lanney Gins, PA-C.      ANESTHESIA:  Spinal.      SPECIMENS:  None.      COMPLICATION:  None.      DRAINS:  One Hemovac.  EBL: <150cc      TOURNIQUET TIME:   Total Tourniquet Time Documented: Thigh (Right) - 36 minutes .      The patient was stable to the recovery room.      INDICATION FOR PROCEDURE:  [name] is a 78 y.o. female patient of   mine.  The patient had been seen, evaluated, and treated conservatively in the   office with medication, activity modification, and injections.  The patient had   radiographic changes of bone-on-bone arthritis with endplate sclerosis and osteophytes noted.      The patient failed conservative measures including medication, injections, and activity modification, and at this point was ready for more definitive measures.   Based on the radiographic changes and failed conservative measures, the patient   decided to proceed with total knee replacement.  Risks of infection,   DVT, component failure, need for revision surgery, postop course, and   expectations were all   discussed and reviewed.  Consent was obtained for benefit of pain   relief.      PROCEDURE IN DETAIL:  The patient was brought to the operative theater.   Once adequate anesthesia, preoperative antibiotics, 2 gm of Ancef administered, the patient was positioned supine with the right thigh tourniquet placed.  The  right lower extremity was prepped and draped in sterile fashion.  A time-   out was performed identifying the patient, planned procedure, and   extremity.      The right lower extremity was placed in the [facility] leg holder.  The leg was   exsanguinated, tourniquet elevated to 250 mmHg.  A midline incision was   made followed by median parapatellar arthrotomy.  Following initial   exposure, attention was first directed to the patella.  Precut  measurement was noted to be 25 mm.  I resected down to 14 mm and used a   38 patellar button to restore patellar height as well as cover the cut   surface.      The lug holes were drilled and a metal shim was placed to protect the   patella from retractors and saw blades.      At this point, attention was now directed to the femur.  The femoral   canal was opened with a drill, irrigated to try to prevent fat emboli.  An   intramedullary rod was passed at 3 degrees valgus, 10 mm of bone was   resected off the distal femur.  Following this resection, the tibia was   subluxated anteriorly.  Using the extramedullary guide, 6 mm of bone was resected off   the proximal lateral tibia.  We confirmed the gap would be   stable medially and laterally with a 10 mm insert as well as confirmed   the cut was perpendicular in the coronal plane, checking with an alignment rod.      Once this was done, I sized the femur to be a size 2.5 in the anterior-   posterior dimension, chose a standard component based on medial and   lateral dimension.  The size 2.5 rotation block was then pinned in   position anterior referenced using the  C-clamp to set rotation.  The   anterior, posterior, and  chamfer cuts were made without difficulty nor   notching making certain that I was along the anterior cortex to help   with flexion gap stability.      The final box cut was made off the lateral aspect of distal femur.      At this point, the tibia was sized to be a size 2.5, the size 2.5 tray was   then pinned in position through the medial third of the tubercle,   drilled, and keel punched.  Trial reduction was now carried with a 2.5 femur,  2.5 tibia, a 12.5 mm insert, and the 38 patella botton.  The knee was brought to   extension, full extension with good flexion stability with the patella   tracking through the trochlea without application of pressure.  Given   all these findings, the trial components removed.  Final components were   opened and cement was mixed.  The knee was irrigated with normal saline   solution and pulse lavage.  The synovial lining was   then injected with 0.25% Marcaine with epinephrine and 1 cc of Toradol,   total of 61 cc.      The knee was irrigated.  Final implants were then cemented onto clean and   dried cut surfaces of bone with the knee brought to extension with a 12.5   mm trial insert.      Once the cement had fully cured, the excess cement was removed   throughout the knee.  I confirmed I was satisfied with the range of   motion and stability, and the final 12.5 mm PS insert was chosen.  It was   placed into the knee.      The tourniquet had been let down at 33 minutes.  No significant   hemostasis required.  The medium Hemovac drain was placed deep.  The   extensor mechanism was then reapproximated using #1 Vicryl with the knee   in flexion.  The   remaining wound was closed with 2-0  Vicryl and running 4-0 Monocryl.   The knee was cleaned, dried, dressed sterilely using Dermabond and   Aquacel dressing.  Drain site dressed separately.  The patient was then   brought to recovery room  in stable condition, tolerating the procedure   well.   Please note that Physician Assistant, Lanney Gins, was present for the entirety of the case, and was utilized for pre-operative positioning, peri-operative retractor management, general facilitation of the procedure.  He was also utilized for primary wound closure at the end of the case.              Madlyn Frankel Charlann Boxer, M.D.

## 2012-05-13 DIAGNOSIS — E669 Obesity, unspecified: Secondary | ICD-10-CM

## 2012-05-13 DIAGNOSIS — D500 Iron deficiency anemia secondary to blood loss (chronic): Secondary | ICD-10-CM

## 2012-05-13 DIAGNOSIS — E871 Hypo-osmolality and hyponatremia: Secondary | ICD-10-CM

## 2012-05-13 LAB — CBC
MCH: 26.5 pg (ref 26.0–34.0)
Platelets: 174 10*3/uL (ref 150–400)
RBC: 3.89 MIL/uL (ref 3.87–5.11)
WBC: 10.2 10*3/uL (ref 4.0–10.5)

## 2012-05-13 LAB — BASIC METABOLIC PANEL
CO2: 26 mEq/L (ref 19–32)
Calcium: 9.3 mg/dL (ref 8.4–10.5)
Chloride: 100 mEq/L (ref 96–112)
GFR calc Af Amer: 80 mL/min — ABNORMAL LOW (ref >90)
Sodium: 134 mEq/L — ABNORMAL LOW (ref 135–145)

## 2012-05-13 NOTE — Evaluation (Signed)
Physical Therapy Evaluation Patient Details Name: [name] MRN: [id number] DOB: 1933/06/26 Today's Date: 05/13/2012 Time: 8295-6213 PT Time Calculation (min): 27 min  PT Assessment / Plan / Recommendation Clinical Impression  Pt s/p R TKR presents with decreased R LE strength/ROM limiting functional mobility    PT Assessment  Patient needs continued PT services    Follow Up Recommendations  Home health PT    Barriers to Discharge        Equipment Recommendations  None recommended by PT    Recommendations for Other Services OT consult   Frequency 7X/week    Precautions / Restrictions Precautions Precautions: Knee Required Braces or Orthoses: Knee Immobilizer - Right Knee Immobilizer - Right: Discontinue once straight leg raise with < 10 degree lag (Pt  performed IND SLR this am) Restrictions Weight Bearing Restrictions: No Other Position/Activity Restrictions: WBAT   Pertinent Vitals/Pain 5/10; premedicated, cold packs provided.      Mobility  Bed Mobility Bed Mobility: Supine to Sit Supine to Sit: 4: Min assist Details for Bed Mobility Assistance: cues for sequence Transfers Transfers: Sit to Stand;Stand to Sit Sit to Stand: 4: Min assist Stand to Sit: 4: Min assist Details for Transfer Assistance: cues for use of UEs and for LE management Ambulation/Gait Ambulation/Gait Assistance: 4: Min assist Ambulation Distance (Feet): 37 Feet Assistive device: Rolling walker Ambulation/Gait Assistance Details: cues for sequence, posture and position from RW Gait Pattern: Step-to pattern    Shoulder Instructions     Exercises Total Joint Exercises Ankle Circles/Pumps: AROM;Both;10 reps;Supine Quad Sets: AROM;Both;10 reps;Supine Heel Slides: AAROM;10 reps;Supine;Right Straight Leg Raises: AROM;10 reps;Supine;Right   PT Diagnosis: Difficulty walking  PT Problem List: Decreased strength;Decreased range of motion;Decreased activity tolerance;Decreased  mobility;Decreased knowledge of use of DME;Pain PT Treatment Interventions: DME instruction;Gait training;Stair training;Functional mobility training;Therapeutic activities;Therapeutic exercise   PT Goals Acute Rehab PT Goals PT Goal Formulation: With patient Time For Goal Achievement: 05/17/12 Potential to Achieve Goals: Good Pt will go Supine/Side to Sit: with supervision PT Goal: Supine/Side to Sit - Progress: Goal set today Pt will go Sit to Supine/Side: with supervision PT Goal: Sit to Supine/Side - Progress: Goal set today Pt will go Sit to Stand: with supervision PT Goal: Sit to Stand - Progress: Goal set today Pt will go Stand to Sit: with supervision PT Goal: Stand to Sit - Progress: Goal set today Pt will Ambulate: 51 - 150 feet;with supervision;with rolling walker PT Goal: Ambulate - Progress: Goal set today Pt will Go Up / Down Stairs: 1-2 stairs;with min assist;with least restrictive assistive device PT Goal: Up/Down Stairs - Progress: Goal set today  Visit Information  Last PT Received On: 05/13/12 Assistance Needed: +1    Subjective Data  Subjective: I had the other one done a few months ago Patient Stated Goal: Resume previous lifestyle with decreased pain   Prior Functioning  Home Living Lives With: Alone Available Help at Discharge: Family;Friend(s) Type of Home: House Home Access: Stairs to enter [employer] of Steps: 1 Entrance Stairs-Rails: None Home Layout: One level Home Adaptive Equipment: Walker - rolling Prior Function Level of Independence: Independent Able to Take Stairs?: Yes Driving: Yes Vocation: Retired Musician: No difficulties Dominant Hand: Right    Cognition  Overall Cognitive Status: Appears within functional limits for tasks assessed/performed Arousal/Alertness: Awake/alert Orientation Level: Appears intact for tasks assessed Behavior During Session: [facility] for tasks performed    Extremity/Trunk  Assessment Right Upper Extremity Assessment RUE ROM/Strength/Tone: [facility] for tasks assessed Left Upper  Extremity Assessment LUE ROM/Strength/Tone: WFL for tasks assessed Right Lower Extremity Assessment RLE ROM/Strength/Tone: Deficits RLE ROM/Strength/Tone Deficits: 3/5 quads with AAROM at knee -5 - 85 Left Lower Extremity Assessment LLE ROM/Strength/Tone: WFL for tasks assessed   Balance    End of Session PT - End of Session Equipment Utilized During Treatment: Gait belt Activity Tolerance: Patient tolerated treatment well Patient left: in chair;with call bell/phone within reach Nurse Communication: Mobility status  GP     Taejon Irani 05/13/2012, 1:02 PM

## 2012-05-13 NOTE — Care Management Note (Unsigned)
    Page 1 of 2   05/13/2012     4:59:13 PM   CARE MANAGEMENT NOTE 05/13/2012  Patient:  [name], [name]   Account Number:  [id number]  Date Initiated:  05/13/2012  Documentation initiated by:  Colleen Can  Subjective/Objective Assessment:   DX osteoarthritis right knee; total knee replacemnt     Action/Plan:   CM spoke with patient. Plans are that patient will return to her home in [place] where sister and friends will take turns as caregivers. She already has RW, cane, raised toilet seat. She is requesting [facility] for HHpt   Anticipated DC Date:  05/15/2012   Anticipated DC Plan:  HOME W HOME HEALTH SERVICES  In-house referral  Clinical Social Worker      DC Planning Services  CM consult      [facility] Choice  HOME HEALTH   Choice offered to / List presented to:  C-1 Patient   DME arranged  NA      DME agency  NA     HH arranged  HH-2 PT      [facility] agency  [facility]   Status of service:  In process, will continue to follow Medicare Important Message given?  NA - LOS <3 / Initial given by admissions (If response is "NO", the following Medicare IM given date fields will be blank) Date Medicare IM given:   Date Additional Medicare IM given:    Discharge Disposition:    Per UR Regulation:    If discussed at Long Length of Stay Meetings, dates discussed:    Comments:  09/'08/2011 [name] BSN CCM [phone] tct [facility] intake-spoke with Kennon Rounds who states they will be able to service patient with start date of 05/15/2012. Faxed HHorders, op note, H&P and face sheet to [phone]-confirmation received. Contact number for [phone]

## 2012-05-13 NOTE — Progress Notes (Signed)
   Subjective: 1 Day Post-Op Procedure(s) (LRB): TOTAL KNEE ARTHROPLASTY (Right)   Patient reports pain as mild, pain well controlled. No events throughout the night.   Objective:   VITALS:   Filed Vitals:   05/13/12 0618  BP: 111/66  Pulse: 77  Temp: 98.1 F (36.7 C)  Resp: 15    Neurovascular intact Dorsiflexion/Plantar flexion intact Incision: dressing C/D/I No cellulitis present Compartment soft  LABS  Basename 05/13/12 0350  HGB 10.3*  HCT 31.4*  WBC 10.2  PLT 174     Basename 05/13/12 0350  NA 134*  K 3.6  BUN 16  CREATININE 0.80  GLUCOSE 151*     Assessment/Plan: 1 Day Post-Op Procedure(s) (LRB): TOTAL KNEE ARTHROPLASTY (Right) Foley cath d/c'ed HV drain d/c'ed Advance diet Up with therapy D/C IV fluids Discharge home with home health tomorrow if continues to do well.   Expected ABLA  Treated with iron and will observe  Obese (BMI 30-39.9)  Estimated Body mass index is 36.11 kg/(m^2) as calculated from the following:   Height as of this encounter: 5' .5"(1.537 m).   Weight as of this encounter: 188 lb(85.276 kg). Patient also counseled that weight may inhibit the healing process Patient counseled that losing weight will help with future health issues  Hyponatremia Treated with IV fluids and will observe       Anastasio Auerbach. Stepen Prins   PAC  05/13/2012, 11:03 AM

## 2012-05-13 NOTE — Progress Notes (Signed)
Physical Therapy Treatment Patient Details Name: [name] MRN: [id number] DOB: August 07, 1933 Today's Date: 05/13/2012 Time: 4098-1191 PT Time Calculation (min): 23 min  PT Assessment / Plan / Recommendation Comments on Treatment Session       Follow Up Recommendations  Home health PT    Barriers to Discharge        Equipment Recommendations  None recommended by PT    Recommendations for Other Services OT consult  Frequency 7X/week   Plan Discharge plan remains appropriate    Precautions / Restrictions Precautions Precautions: Knee Required Braces or Orthoses: Knee Immobilizer - Right Knee Immobilizer - Right: Discontinue once straight leg raise with < 10 degree lag Restrictions Weight Bearing Restrictions: No Other Position/Activity Restrictions: WBAT   Pertinent Vitals/Pain 4/10    Mobility  Bed Mobility Bed Mobility: Sit to Supine Sit to Supine: 4: Min assist Details for Bed Mobility Assistance: cues for sequence Transfers Transfers: Sit to Stand;Stand to Sit Sit to Stand: 4: Min assist Stand to Sit: 4: Min assist Details for Transfer Assistance: cues for use of UEs and for LE management Ambulation/Gait Ambulation/Gait Assistance: 4: Min Environmental consultant (Feet): 123 Feet Assistive device: Rolling walker Ambulation/Gait Assistance Details: cues for position from RW and posture Gait Pattern: Step-to pattern    Exercises     PT Diagnosis:    PT Problem List:   PT Treatment Interventions:     PT Goals Acute Rehab PT Goals PT Goal Formulation: With patient Time For Goal Achievement: 05/17/12 Potential to Achieve Goals: Good Pt will go Supine/Side to Sit: with supervision PT Goal: Supine/Side to Sit - Progress: Goal set today Pt will go Sit to Supine/Side: with supervision PT Goal: Sit to Supine/Side - Progress: Goal set today Pt will go Sit to Stand: with supervision PT Goal: Sit to Stand - Progress: Progressing toward goal Pt will go Stand to  Sit: with supervision PT Goal: Stand to Sit - Progress: Progressing toward goal Pt will Ambulate: 51 - 150 feet;with supervision;with rolling walker PT Goal: Ambulate - Progress: Progressing toward goal Pt will Go Up / Down Stairs: 1-2 stairs;with min assist;with least restrictive assistive device PT Goal: Up/Down Stairs - Progress: Goal set today  Visit Information  Last PT Received On: 05/13/12 Assistance Needed: +1    Subjective Data  Patient Stated Goal: Resume previous lifestyle with decreased pain   Cognition  Overall Cognitive Status: Appears within functional limits for tasks assessed/performed Arousal/Alertness: Awake/alert Orientation Level: Appears intact for tasks assessed Behavior During Session: [facility] for tasks performed    Balance     End of Session PT - End of Session Equipment Utilized During Treatment: Gait belt Activity Tolerance: Patient tolerated treatment well Patient left: in bed;with call bell/phone within reach Nurse Communication: Mobility status   GP     Illana Nolting 05/13/2012, 3:47 PM

## 2012-05-14 LAB — CBC
MCV: 80.8 fL (ref 78.0–100.0)
Platelets: 165 10*3/uL (ref 150–400)
RBC: 3.65 MIL/uL — ABNORMAL LOW (ref 3.87–5.11)
WBC: 9.4 10*3/uL (ref 4.0–10.5)

## 2012-05-14 LAB — BASIC METABOLIC PANEL
CO2: 29 mEq/L (ref 19–32)
Chloride: 100 mEq/L (ref 96–112)
Creatinine, Ser: 0.77 mg/dL (ref 0.50–1.10)
Potassium: 3.2 mEq/L — ABNORMAL LOW (ref 3.5–5.1)

## 2012-05-14 MED ORDER — DSS 100 MG PO CAPS: 100.0000 mg | ORAL_CAPSULE | Freq: Two times a day (BID) | ORAL | Status: DC

## 2012-05-14 MED ORDER — ASPIRIN EC 325 MG PO TBEC: 325.0000 mg | DELAYED_RELEASE_TABLET | Freq: Two times a day (BID) | ORAL | Status: DC

## 2012-05-14 MED ORDER — CELECOXIB 200 MG PO CAPS: 200.0000 mg | ORAL_CAPSULE | Freq: Two times a day (BID) | ORAL | Status: DC

## 2012-05-14 MED ORDER — POLYETHYLENE GLYCOL 3350 17 G PO PACK: 17.0000 g | PACK | Freq: Two times a day (BID) | ORAL | Status: DC

## 2012-05-14 MED ORDER — DIPHENHYDRAMINE HCL 25 MG PO CAPS: 25.0000 mg | ORAL_CAPSULE | Freq: Four times a day (QID) | ORAL | Status: DC | PRN

## 2012-05-14 MED ORDER — METHOCARBAMOL 500 MG PO TABS: 500.0000 mg | ORAL_TABLET | Freq: Four times a day (QID) | ORAL | Status: DC | PRN

## 2012-05-14 MED ORDER — FERROUS SULFATE 325 (65 FE) MG PO TABS: 325.0000 mg | ORAL_TABLET | Freq: Three times a day (TID) | ORAL | Status: DC

## 2012-05-14 MED ORDER — HYDROCODONE-ACETAMINOPHEN 7.5-325 MG PO TABS: 1.0000 | ORAL_TABLET | ORAL | Status: DC | PRN

## 2012-05-14 NOTE — Progress Notes (Signed)
Physical Therapy Treatment Patient Details Name: [name] MRN: [id number] DOB: August 13, 1933 Today's Date: 05/14/2012 Time: 0865-7846 PT Time Calculation (min): 25 min  PT Assessment / Plan / Recommendation Comments on Treatment Session       Follow Up Recommendations  Home health PT    Barriers to Discharge        Equipment Recommendations  None recommended by PT;None recommended by OT    Recommendations for Other Services OT consult  Frequency 7X/week   Plan Discharge plan remains appropriate    Precautions / Restrictions Precautions Precautions: Knee Required Braces or Orthoses: Knee Immobilizer - Right Knee Immobilizer - Right: Other (comment) (Able to perform SLR) Restrictions Weight Bearing Restrictions: No Other Position/Activity Restrictions: WBAT   Pertinent Vitals/Pain 3/10    Mobility  Transfers Transfers: Sit to Stand;Stand to Sit Sit to Stand: 4: Min guard;With upper extremity assist;From chair/3-in-1;From toilet Stand to Sit: 4: Min guard;With upper extremity assist;To chair/3-in-1;To toilet Details for Transfer Assistance: min verbal cues for safety Ambulation/Gait Ambulation/Gait Assistance: 4: Min guard;5: Supervision Ambulation Distance (Feet): 158 Feet Assistive device: Rolling walker Ambulation/Gait Assistance Details: cues for posture and position from RW Gait Pattern: Step-to pattern    Exercises Total Joint Exercises Ankle Circles/Pumps: AROM;Both;Supine;20 reps Quad Sets: AROM;Both;Supine;20 reps Heel Slides: AAROM;Supine;Right;20 reps Straight Leg Raises: AROM;Supine;Right;20 reps;AAROM   PT Diagnosis:    PT Problem List:   PT Treatment Interventions:     PT Goals Acute Rehab PT Goals PT Goal Formulation: With patient Time For Goal Achievement: 05/17/12 Potential to Achieve Goals: Good Pt will go Supine/Side to Sit: with supervision PT Goal: Supine/Side to Sit - Progress: Progressing toward goal Pt will go Sit to Supine/Side:  with supervision PT Goal: Sit to Supine/Side - Progress: Progressing toward goal Pt will go Sit to Stand: with supervision PT Goal: Sit to Stand - Progress: Progressing toward goal Pt will go Stand to Sit: with supervision PT Goal: Stand to Sit - Progress: Progressing toward goal Pt will Ambulate: 51 - 150 feet;with supervision;with rolling walker PT Goal: Ambulate - Progress: Progressing toward goal  Visit Information  Last PT Received On: 05/14/12 Assistance Needed: +1    Subjective Data  Patient Stated Goal: Resume previous lifestyle with decreased pain   Cognition  Overall Cognitive Status: Appears within functional limits for tasks assessed/performed Arousal/Alertness: Awake/alert Orientation Level: Appears intact for tasks assessed Behavior During Session: [facility] for tasks performed    Balance     End of Session PT - End of Session Activity Tolerance: Patient tolerated treatment well Patient left: in chair;with call bell/phone within reach Nurse Communication: Mobility status   GP     Matteson Blue 05/14/2012, 12:34 PM

## 2012-05-14 NOTE — Progress Notes (Signed)
Physical Therapy Treatment Patient Details Name: [name] MRN: [id number] DOB: [birth year] Today's Date: 05/14/2012 Time: 4098-1191 PT Time Calculation (min): 17 min  PT Assessment / Plan / Recommendation Comments on Treatment Session       Follow Up Recommendations  Home health PT    Barriers to Discharge        Equipment Recommendations  None recommended by PT;None recommended by OT    Recommendations for Other Services OT consult  Frequency 7X/week   Plan Discharge plan remains appropriate    Precautions / Restrictions Precautions Precautions: Knee Required Braces or Orthoses: Knee Immobilizer - Right Knee Immobilizer - Right: Discontinue once straight leg raise with < 10 degree lag Restrictions Weight Bearing Restrictions: No Other Position/Activity Restrictions: WBAT   Pertinent Vitals/Pain     Mobility  Transfers Transfers: Sit to Stand;Stand to Sit Sit to Stand: 5: Supervision Stand to Sit: 5: Supervision Details for Transfer Assistance: min verbal cues for safety Ambulation/Gait Ambulation/Gait Assistance: 4: Min guard;5: Supervision Ambulation Distance (Feet): 123 Feet Assistive device: Rolling walker Ambulation/Gait Assistance Details: min cues for position from RW Gait Pattern: Step-to pattern Stairs: Yes Stairs Assistance: 4: Min assist Stairs Assistance Details (indicate cue type and reason): cues for sequence and foot/RW placement Stair Management Technique: No rails;Step to pattern;With walker;Forwards;Backwards Number of Stairs: 1  (once fwd, once bkwd)    Exercises     PT Diagnosis:    PT Problem List:   PT Treatment Interventions:     PT Goals Acute Rehab PT Goals PT Goal Formulation: With patient Time For Goal Achievement: 05/17/12 Potential to Achieve Goals: Good Pt will go Supine/Side to Sit: with supervision PT Goal: Supine/Side to Sit - Progress: Progressing toward goal Pt will go Sit to Supine/Side: with supervision PT Goal:  Sit to Supine/Side - Progress: Progressing toward goal Pt will go Sit to Stand: with supervision PT Goal: Sit to Stand - Progress: Met Pt will go Stand to Sit: with supervision PT Goal: Stand to Sit - Progress: Met Pt will Ambulate: 51 - 150 feet;with supervision;with rolling walker PT Goal: Ambulate - Progress: Met Pt will Go Up / Down Stairs: 1-2 stairs;with min assist;with least restrictive assistive device PT Goal: Up/Down Stairs - Progress: Met  Visit Information  Last PT Received On: 05/14/12 Assistance Needed: +1    Subjective Data  Subjective: I'm going home this afternoon Patient Stated Goal: Resume previous lifestyle with decreased pain   Cognition  Overall Cognitive Status: Appears within functional limits for tasks assessed/performed Arousal/Alertness: Awake/alert Orientation Level: Appears intact for tasks assessed Behavior During Session: [facility] for tasks performed    Balance     End of Session PT - End of Session Activity Tolerance: Patient tolerated treatment well Patient left: in chair;with call bell/phone within reach Nurse Communication: Mobility status   GP     Elizeth Weinrich 05/14/2012, 4:07 PM

## 2012-05-14 NOTE — Progress Notes (Signed)
Pt to d/c home with home health. 3 in 1 delivered to room. AVS reviewed and "My Chart" discussed with pt. Pt capable of verbalizing medications and follow-up appointments. Remains hemodynamically stable. No signs and symptoms of distress. Educated pt to return to ER in the case of SOB, dizziness, or chest pain.

## 2012-05-14 NOTE — Evaluation (Addendum)
Occupational Therapy Evaluation Patient Details Name: [name] MRN: [id number] DOB: 03-Nov-1932 Today's Date: 05/14/2012 Time: 4098-1191 OT Time Calculation (min): 17 min  OT Assessment / Plan / Recommendation Clinical Impression  Pt is s/p R TKA and had the opposite knee replaced in June of this year per pt. She is doing well and all OT education completed.     OT Assessment  Patient does not need any further OT services    Follow Up Recommendations  No OT follow up;Supervision/Assistance - 24 hour    Barriers to Discharge      Equipment Recommendations  None recommended by PT;None recommended by OT    Recommendations for Other Services    Frequency       Precautions / Restrictions Precautions Precautions: Knee Knee Immobilizer - Right: Other (comment) (Able to perform SLR) Restrictions Weight Bearing Restrictions: No Other Position/Activity Restrictions: WBAT        ADL  Eating/Feeding: Simulated;Independent Where Assessed - Eating/Feeding: Chair Grooming: Simulated;Set up Where Assessed - Grooming: Supported sitting Upper Body Bathing: Simulated;Chest;Right arm;Left arm;Abdomen;Set up Where Assessed - Upper Body Bathing: Unsupported sitting Lower Body Bathing: Simulated;Minimal assistance Where Assessed - Lower Body Bathing: Supported sit to stand Upper Body Dressing: Simulated;Set up Where Assessed - Upper Body Dressing: Unsupported sitting Lower Body Dressing: Simulated;Minimal assistance Where Assessed - Lower Body Dressing: Supported sit to stand Toilet Transfer: Performed min guard assist. 2 attempts to stand but steady. Acupuncturist: Comfort height toilet;Grab bars Toileting - Architect and Hygiene: Simulated;Min guard Where Assessed - Engineer, mining and Hygiene: Sit to stand from 3-in-1 or toilet Tub/Shower Transfer: Simulated;Min guard Equipment Used: Rolling walker ADL Comments: Gave min verbal cues to slide  RW along and not pick up RW. Practiced shower transfer as pt with just a small ledge to step over (per her report about 1 inch). Educated on how caregiver is to assist with shower transfer. Pt declined need for 3in1. States she feels her higher toielt and vanity on L will be enough. She did well with toilet transfer here just needed 2 attempts to get enough leverage to stand but was steady.  Feel she will progress with functional mobility with PT and she has assist for ADL at discharge. Plan is for discharge today.   OT Diagnosis:    OT Problem List:   OT Treatment Interventions:     OT Goals    Visit Information  Last OT Received On: 05/14/12 Assistance Needed: +1    Subjective Data  Subjective: I am doing ok Patient Stated Goal: none stated but agreeble to work with OT   Prior Functioning     Home Living Lives With: Alone Available Help at Discharge: Family;Friend(s) Type of Home: House Home Access: Stairs to enter Secretary/administrator of Steps: 1 Entrance Stairs-Rails: None Home Layout: One level Bathroom Shower/Tub: Health visitor: Handicapped height (vanity on L) Home Adaptive Equipment: Walker - rolling Prior Function Level of Independence: Independent Able to Take Stairs?: Yes Driving: Yes Vocation: Retired Musician: No difficulties Dominant Hand: Right         Vision/Perception     Cognition  Overall Cognitive Status: Appears within functional limits for tasks assessed/performed    Extremity/Trunk Assessment Right Upper Extremity Assessment RUE ROM/Strength/Tone: [facility] for tasks assessed Left Upper Extremity Assessment LUE ROM/Strength/Tone: WFL for tasks assessed     Mobility Transfers Transfers: Sit to Stand;Stand to Sit Sit to Stand: 4: Min guard;With upper extremity assist;From chair/3-in-1;From toilet  Stand to Sit: 4: Min guard;With upper extremity assist;To chair/3-in-1;To toilet Details for Transfer  Assistance: min verbal cues for safety     Shoulder Instructions     Exercise     Balance     End of Session OT - End of Session Activity Tolerance: Patient tolerated treatment well Patient left: in chair;with call bell/phone within reach CPM Right Knee CPM Right Knee: Off  GO     Lennox Laity [phone] 05/14/2012, 11:04 AM

## 2012-05-14 NOTE — Progress Notes (Signed)
   Subjective: 2 Days Post-Op Procedure(s) (LRB): TOTAL KNEE ARTHROPLASTY (Right)   Patient reports pain as mild, pain well controlled. No events throughout the night. Ready to be discharged home.  Objective:   VITALS:   Filed Vitals:   05/13/12 2144  BP: 114/63  Pulse: 89  Temp: 97.5 F (36.4 C)  Resp: 14    Neurovascular intact Dorsiflexion/Plantar flexion intact Incision: dressing C/D/I No cellulitis present Compartment soft  LABS  Basename 05/14/12 0410 05/13/12 0350  HGB 10.0* 10.3*  HCT 29.5* 31.4*  WBC 9.4 10.2  PLT 165 174     Basename 05/14/12 0410 05/13/12 0350  NA 135 134*  K 3.2* 3.6  BUN 19 16  CREATININE 0.77 0.80  GLUCOSE 156* 151*     Assessment/Plan: 2 Days Post-Op Procedure(s) (LRB): TOTAL KNEE ARTHROPLASTY (Right) Up with therapy Discharge home with home health Follow up in 2 weeks at [facility]. Follow-up Information    Follow up with OLIN,Hermes Wafer D in 2 weeks.   Contact information:   [facility] [address], [address] [place] [address] [phone]         Expected ABLA  Treated with iron and will observe   Obese (BMI 30-39.9)  Estimated Body mass index is 36.11 kg/(m^2) as calculated from the following:  Height as of this encounter: 5' .5"(1.537 m).  Weight as of this encounter: 188 lb(85.276 kg).  Patient also counseled that weight may inhibit the healing process  Patient counseled that losing weight will help with future health issues    Anastasio Auerbach. Nevaeh Korte   PAC  05/14/2012, 8:39 AM

## 2012-05-15 NOTE — Progress Notes (Signed)
CARE MANAGEMENT NOTE 05/15/2012  Patient:  Kristin, Stephenson   Account Number:  [id number]  Date Initiated:  05/13/2012  Documentation initiated by:  Colleen Can  Subjective/Objective Assessment:   DX osteoarthritis right knee; total knee replacemnt     Action/Plan:   CM spoke with patient. Plans are that patient will return to her home in [place] where sister and friends will take turns as caregivers. She already has RW, cane, raised toilet seat. She is requesting [facility] for HHpt   Anticipated DC Date:  05/15/2012   Anticipated DC Plan:  HOME W HOME HEALTH SERVICES  In-house referral  Clinical Social Worker      DC Planning Services  CM consult      [facility] Choice  HOME HEALTH   Choice offered to / List presented to:  C-1 Patient   DME arranged  NA      DME agency  NA     HH arranged  HH-2 PT      [facility] agency  [facility]   Status of service:  Completed, signed off Medicare Important Message given?  NA - LOS <3 / Initial given by admissions (If response is "NO", the following Medicare IM given date fields will be blank) Date Medicare IM given:   Date Additional Medicare IM given:    Discharge Disposition:  HOME W HOME HEALTH SERVICES  Per UR Regulation:  Reviewed for med. necessity/level of care/duration of stay  If discussed at Long Length of Stay Meetings, dates discussed:    Comments:  05/15/2012 [name] BSN CCM [phone] Pt discharged with [facility] services in place 05/14/2012. [facility] notified of patient's discharge. Start date 05/15/2012.

## 2012-05-16 NOTE — Discharge Summary (Signed)
Physician Discharge Summary  Patient ID: YARIELIZ WASSER MRN: [id number] DOB/AGE: 76-Feb-1934 76 y.o.  Admit date: 05/12/2012 Discharge date: 05/14/2012   Procedures:  Procedure(s) (LRB): TOTAL KNEE ARTHROPLASTY (Right)  Attending Physician:  Dr. Durene Romans   Admission Diagnoses:   Right knee OA and pain   Discharge Diagnoses:  Principal Problem:  *S/P right TKA Active Problems:  Expected blood loss anemia  Hyponatremia  Obese Hypertension   GERD (gastroesophageal reflux disease)   Arthritis   Heart murmur - mild, no cardiologist   Cancer - skin cancer moles removed 2013   Complication of anesthesia    HPI: Pt is a 78 y.o. female complaining of right knee pain for 3+ years. Pain had continually increased since the beginning. She had a previous left total knee arthroplasty on 01/21/2012. She has recovered well from this surgery and is looking forward to getting her right knee surgery. X-rays in the clinic show end-stage arthritic changes of the right knee. Pt has tried various conservative treatments which have failed to alleviate their symptoms, including NSAIDs and steroid injections. Various options are discussed with the patient. Risks, benefits and expectations were discussed with the patient. Patient understand the risks, benefits and expectations and wishes to proceed with surgery.  PCP: Lenora Boys, MD   Discharged Condition: good  Hospital Course:  Patient underwent the above stated procedure on 05/12/2012. Patient tolerated the procedure well and brought to the recovery room in good condition and subsequently to the floor.  POD #1 BP: 111/66 ; Pulse: 77 ; Temp: 98.1 F (36.7 C) ; Resp: 15 Pt's foley was removed, as well as the hemovac drain removed. IV was changed to a saline lock. Patient reports pain as mild, pain well controlled. No events throughout the night.  Neurovascular intact, dorsiflexion/plantar flexion intact, incision: dressing C/D/I, no  cellulitis present and compartment soft.   LABS  Basename  05/13/12 0350   HGB  10.3  HCT  31.4   POD #2  BP: 114/63 ; Pulse: 89 ; Temp: 97.5 F (36.4 C) ; Resp: 14  Patient reports pain as mild, pain well controlled. No events throughout the night. Ready to be discharged home. Neurovascular intact, dorsiflexion/plantar flexion intact, incision: dressing C/D/I, no cellulitis present and compartment soft.   LABS  Basename  05/14/12 0410   HGB  10.0  HCT  29.5    Discharge Exam: General appearance: alert, cooperative and no distress Extremities: Homans sign is negative, no sign of DVT, no edema, redness or tenderness in the calves or thighs and no ulcers, gangrene or trophic changes  Disposition: Home-Health Care Svc with follow up in 2 weeks   Follow-up Information    Follow up with [name], MD. In 2 weeks.   Contact information:   [facility] [address] [place] [address] [phone]          Discharge Orders    Future Orders Please Complete By Expires   Diet - low sodium heart healthy      Call MD / Call 911      Comments:   If you experience chest pain or shortness of breath, CALL 911 and be transported to the hospital emergency room.  If you develope a fever above 101 F, pus (white drainage) or increased drainage or redness at the wound, or calf pain, call your surgeon's office.   Discharge instructions      Comments:   Maintain surgical dressing for  8 days, then replace with gauze and tape. Keep the area dry and clean until follow up. Follow up in 2 weeks at [facility]. Call with any questions or concerns.   Constipation Prevention      Comments:   Drink plenty of fluids.  Prune juice may be helpful.  You may use a stool softener, such as Colace (over the counter) 100 mg twice a day.  Use MiraLax (over the counter) for constipation as needed.   Increase activity slowly as tolerated      Driving restrictions       Comments:   No driving for 4 weeks   TED hose      Comments:   Use stockings (TED hose) for 2 weeks on both leg(s).  You may remove them at night for sleeping.   Change dressing      Comments:   Maintain surgical dressing for 8 days, then change the dressing daily with sterile 4 x 4 inch gauze dressing and tape. Keep the area dry and clean.      Discharge Medication List as of 05/14/2012  1:05 PM    START taking these medications   Details  aspirin EC 325 MG tablet Take 1 tablet (325 mg total) by mouth 2 (two) times daily. X 4 weeks, Starting 05/14/2012, Until Discontinued, No Print    celecoxib (CELEBREX) 200 MG capsule Take 1 capsule (200 mg total) by mouth every 12 (twelve) hours., Starting 05/14/2012, Until Discontinued, No Print    diphenhydrAMINE (BENADRYL) 25 mg capsule Take 1 capsule (25 mg total) by mouth every 6 (six) hours as needed for itching, allergies or sleep., Starting 05/14/2012, Until Discontinued, No Print    docusate sodium 100 MG CAPS Take 100 mg by mouth 2 (two) times daily., Starting 05/14/2012, Until Discontinued, No Print    HYDROcodone-acetaminophen (NORCO) 7.5-325 MG per tablet Take 1-2 tablets by mouth every 4 (four) hours as needed for pain., Starting 05/14/2012, Until Discontinued, Print    methocarbamol (ROBAXIN) 500 MG tablet Take 1 tablet (500 mg total) by mouth every 6 (six) hours as needed (muscle spasms)., Starting 05/14/2012, Until Discontinued, No Print    polyethylene glycol (MIRALAX / GLYCOLAX) packet Take 17 g by mouth 2 (two) times daily., Starting 05/14/2012, Until Discontinued, No Print      CONTINUE these medications which have CHANGED   Details  ferrous sulfate 325 (65 FE) MG tablet Take 1 tablet (325 mg total) by mouth 3 (three) times daily after meals., Starting 05/14/2012, Until Discontinued, No Print      CONTINUE these medications which have NOT CHANGED   Details  DULoxetine (CYMBALTA) 60 MG capsule Take 60 mg by mouth 2 (two)  times daily., Until Discontinued, Historical Med    losartan-hydrochlorothiazide (HYZAAR) 100-25 MG per tablet Take 1 tablet by mouth daily with breakfast. , Until Discontinued, Historical Med    omeprazole (PRILOSEC) 20 MG capsule Take 20 mg by mouth every morning. , Until Discontinued, Historical Med    zolpidem (AMBIEN) 10 MG tablet Take 10 mg by mouth at bedtime as needed. For insomnia., Until Discontinued, Historical Med    calcium-vitamin D (OSCAL WITH D) 500-200 MG-UNIT per tablet Take 1 tablet by mouth daily., Until Discontinued, Historical Med    Multiple Vitamin (MULITIVITAMIN WITH MINERALS) TABS Take 1 tablet by mouth daily., Until Discontinued, Historical Med    Vitamin D, Ergocalciferol, (DRISDOL) 50000 UNITS CAPS Take 50,000 Units by mouth every 7 (seven) days. Takes on Tuesdays., Until Discontinued,  Historical Med      STOP taking these medications     meloxicam (MOBIC) 15 MG tablet Comments:  Reason for Stopping:           Signed: Anastasio Auerbach. Malai Lady   PAC  05/16/2012, 5:44 PM

## 2013-01-16 ENCOUNTER — Other Ambulatory Visit: Payer: Self-pay | Admitting: Family Medicine

## 2017-12-27 ENCOUNTER — Other Ambulatory Visit: Payer: Self-pay | Admitting: Family Medicine

## 2017-12-27 DIAGNOSIS — M545 Low back pain: Principal | ICD-10-CM

## 2017-12-27 DIAGNOSIS — G8929 Other chronic pain: Secondary | ICD-10-CM

## 2017-12-30 ENCOUNTER — Other Ambulatory Visit: Payer: Self-pay | Admitting: Family Medicine

## 2017-12-30 DIAGNOSIS — M545 Low back pain: Secondary | ICD-10-CM

## 2018-01-07 ENCOUNTER — Ambulatory Visit
Admission: RE | Admit: 2018-01-07 | Discharge: 2018-01-07 | Disposition: A | Discharge status: Home / Self Care | Payer: Medicare Other | Source: Ambulatory Visit | Attending: Family Medicine | Admitting: Family Medicine

## 2018-01-07 DIAGNOSIS — M545 Low back pain: Secondary | ICD-10-CM

## 2018-01-21 ENCOUNTER — Other Ambulatory Visit: Payer: Medicare Other

## 2018-01-21 ENCOUNTER — Ambulatory Visit
Admission: RE | Admit: 2018-01-21 | Discharge: 2018-01-21 | Disposition: A | Discharge status: Home / Self Care | Payer: Medicare Other | Source: Ambulatory Visit | Attending: Family Medicine | Admitting: Family Medicine

## 2018-01-21 DIAGNOSIS — G8929 Other chronic pain: Secondary | ICD-10-CM

## 2018-01-21 DIAGNOSIS — M545 Low back pain: Principal | ICD-10-CM

## 2018-01-21 MED ORDER — IOPAMIDOL (ISOVUE-M 200) INJECTION 41%
1.0000 mL | Freq: Once | INTRAMUSCULAR | Status: AC
  Administered 2018-01-21: 1 mL via EPIDURAL

## 2018-01-21 MED ORDER — METHYLPREDNISOLONE ACETATE 40 MG/ML INJ SUSP (RADIOLOG
120.0000 mg | Freq: Once | INTRAMUSCULAR | Status: AC
  Administered 2018-01-21: 120 mg via EPIDURAL

## 2018-01-21 NOTE — Discharge Instructions (Signed)

## 2018-12-06 IMAGING — XA Imaging study
2 series · 2 of 2 positions shown · non-contrast
Comparison: none

CLINICAL DATA: Lumbosacral spondylosis without myelopathy. Chronic,
midline low back pain without radiculopathy. Severe spinal canal
stenosis at L4-L5.

[Series 1: ortho standard · 1 of 1 slices shown (1 of 2)]
[im 1/1]
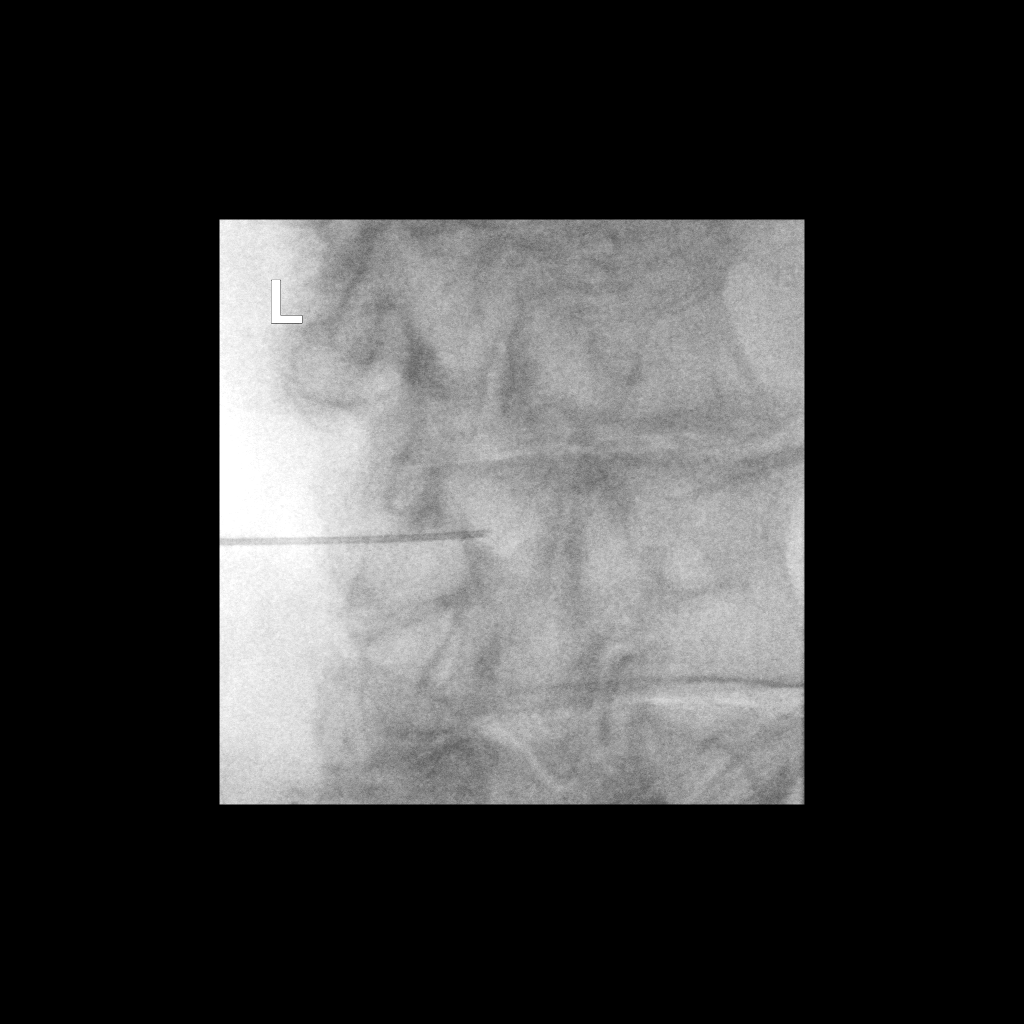

[Series 2: ortho standard · 1 of 1 slices shown (2 of 2)]
[im 1/1]
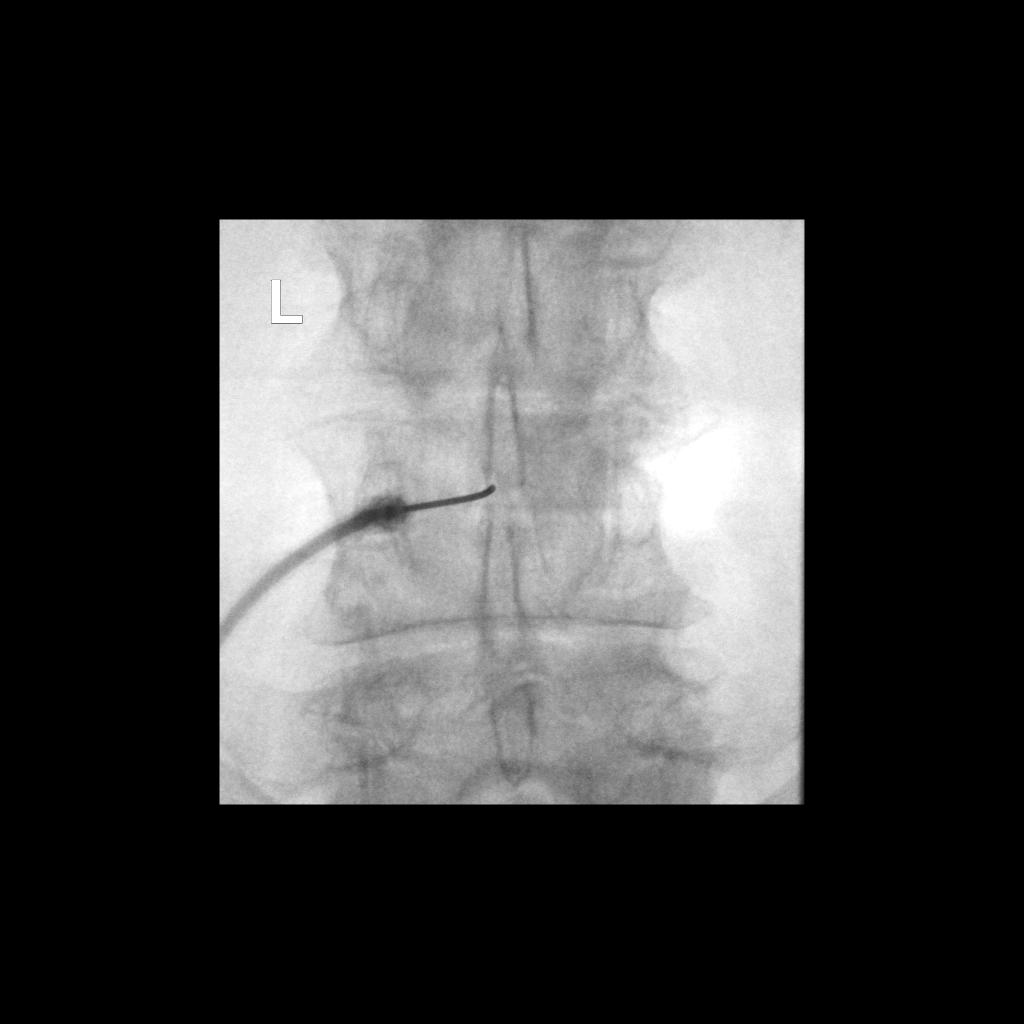

[2 of 2 positions shown; findings below may reference images not displayed]

FLUOROSCOPY TIME:  Radiation Exposure Index (as provided by the
fluoroscopic device): 35 seconds

Fluoroscopy Time:  48.62 Gy*m2

Number of Acquired Images:  0

PROCEDURE:
The procedure, risks, benefits, and alternatives were explained to
the patient. Questions regarding the procedure were encouraged and
answered. The patient understands and consents to the procedure.

LUMBAR EPIDURAL INJECTION:

An interlaminar approach was performed on the left at L3-L4. The
overlying skin was cleansed and anesthetized. A 3.5 inch 20 gauge
epidural needle was advanced using loss-of-resistance technique.

DIAGNOSTIC EPIDURAL INJECTION:

Injection of Isovue-M 200 shows a good epidural pattern with spread
above and below the level of needle placement, primarily on the
right. No vascular opacification is seen.

THERAPEUTIC EPIDURAL INJECTION:

120 mg of Depo-Medrol mixed with 3 mL of 1% lidocaine were
instilled. The procedure was well-tolerated, and the patient was
discharged thirty minutes following the injection in good condition.

COMPLICATIONS:
None immediate.
IMPRESSION: Technically successful epidural injection on the left at L3-L4 #1.

## 2021-08-21 DIAGNOSIS — D0462 Carcinoma in situ of skin of left upper limb, including shoulder: Secondary | ICD-10-CM | POA: Diagnosis not present

## 2021-08-21 DIAGNOSIS — W908XXS Exposure to other nonionizing radiation, sequela: Secondary | ICD-10-CM | POA: Diagnosis not present

## 2021-08-21 DIAGNOSIS — L578 Other skin changes due to chronic exposure to nonionizing radiation: Secondary | ICD-10-CM | POA: Diagnosis not present

## 2021-08-21 DIAGNOSIS — Z859 Personal history of malignant neoplasm, unspecified: Secondary | ICD-10-CM | POA: Diagnosis not present

## 2021-10-18 DIAGNOSIS — D0462 Carcinoma in situ of skin of left upper limb, including shoulder: Secondary | ICD-10-CM | POA: Diagnosis not present

## 2021-10-18 DIAGNOSIS — D485 Neoplasm of uncertain behavior of skin: Secondary | ICD-10-CM | POA: Diagnosis not present

## 2021-10-18 DIAGNOSIS — L570 Actinic keratosis: Secondary | ICD-10-CM | POA: Diagnosis not present

## 2021-10-18 DIAGNOSIS — L821 Other seborrheic keratosis: Secondary | ICD-10-CM | POA: Diagnosis not present

## 2021-10-18 DIAGNOSIS — Z86008 Personal history of in-situ neoplasm of other site: Secondary | ICD-10-CM | POA: Diagnosis not present

## 2021-10-18 DIAGNOSIS — Z85828 Personal history of other malignant neoplasm of skin: Secondary | ICD-10-CM | POA: Diagnosis not present

## 2021-10-26 DIAGNOSIS — H52223 Regular astigmatism, bilateral: Secondary | ICD-10-CM | POA: Diagnosis not present

## 2021-10-26 DIAGNOSIS — H524 Presbyopia: Secondary | ICD-10-CM | POA: Diagnosis not present

## 2021-10-26 DIAGNOSIS — Z961 Presence of intraocular lens: Secondary | ICD-10-CM | POA: Diagnosis not present

## 2021-10-26 DIAGNOSIS — Z135 Encounter for screening for eye and ear disorders: Secondary | ICD-10-CM | POA: Diagnosis not present

## 2021-10-26 DIAGNOSIS — H43813 Vitreous degeneration, bilateral: Secondary | ICD-10-CM | POA: Diagnosis not present

## 2021-10-26 DIAGNOSIS — H04123 Dry eye syndrome of bilateral lacrimal glands: Secondary | ICD-10-CM | POA: Diagnosis not present

## 2021-10-26 DIAGNOSIS — I10 Essential (primary) hypertension: Secondary | ICD-10-CM | POA: Diagnosis not present

## 2021-10-27 DIAGNOSIS — Z7982 Long term (current) use of aspirin: Secondary | ICD-10-CM | POA: Diagnosis not present

## 2021-10-27 DIAGNOSIS — E785 Hyperlipidemia, unspecified: Secondary | ICD-10-CM | POA: Diagnosis not present

## 2021-10-27 DIAGNOSIS — I10 Essential (primary) hypertension: Secondary | ICD-10-CM | POA: Diagnosis not present

## 2021-10-27 DIAGNOSIS — K219 Gastro-esophageal reflux disease without esophagitis: Secondary | ICD-10-CM | POA: Diagnosis not present

## 2021-10-27 DIAGNOSIS — Z008 Encounter for other general examination: Secondary | ICD-10-CM | POA: Diagnosis not present

## 2021-10-27 DIAGNOSIS — M1990 Unspecified osteoarthritis, unspecified site: Secondary | ICD-10-CM | POA: Diagnosis not present

## 2021-10-27 DIAGNOSIS — R69 Illness, unspecified: Secondary | ICD-10-CM | POA: Diagnosis not present

## 2021-10-27 DIAGNOSIS — G8929 Other chronic pain: Secondary | ICD-10-CM | POA: Diagnosis not present

## 2021-10-27 DIAGNOSIS — Z823 Family history of stroke: Secondary | ICD-10-CM | POA: Diagnosis not present

## 2021-10-27 DIAGNOSIS — Z8051 Family history of malignant neoplasm of kidney: Secondary | ICD-10-CM | POA: Diagnosis not present

## 2021-10-27 DIAGNOSIS — G629 Polyneuropathy, unspecified: Secondary | ICD-10-CM | POA: Diagnosis not present

## 2021-10-27 DIAGNOSIS — Z8249 Family history of ischemic heart disease and other diseases of the circulatory system: Secondary | ICD-10-CM | POA: Diagnosis not present

## 2021-10-31 DIAGNOSIS — M25511 Pain in right shoulder: Secondary | ICD-10-CM | POA: Diagnosis not present

## 2021-10-31 DIAGNOSIS — M19011 Primary osteoarthritis, right shoulder: Secondary | ICD-10-CM | POA: Diagnosis not present

## 2022-02-07 DIAGNOSIS — Z1231 Encounter for screening mammogram for malignant neoplasm of breast: Secondary | ICD-10-CM | POA: Diagnosis not present

## 2022-02-14 DIAGNOSIS — M19011 Primary osteoarthritis, right shoulder: Secondary | ICD-10-CM | POA: Diagnosis not present

## 2022-02-14 DIAGNOSIS — M25511 Pain in right shoulder: Secondary | ICD-10-CM | POA: Diagnosis not present

## 2022-02-21 DIAGNOSIS — Z87898 Personal history of other specified conditions: Secondary | ICD-10-CM | POA: Diagnosis not present

## 2022-02-21 DIAGNOSIS — Z0000 Encounter for general adult medical examination without abnormal findings: Secondary | ICD-10-CM | POA: Diagnosis not present

## 2022-02-21 DIAGNOSIS — M159 Polyosteoarthritis, unspecified: Secondary | ICD-10-CM | POA: Diagnosis not present

## 2022-02-21 DIAGNOSIS — Z1231 Encounter for screening mammogram for malignant neoplasm of breast: Secondary | ICD-10-CM | POA: Diagnosis not present

## 2022-02-21 DIAGNOSIS — I129 Hypertensive chronic kidney disease with stage 1 through stage 4 chronic kidney disease, or unspecified chronic kidney disease: Secondary | ICD-10-CM | POA: Diagnosis not present

## 2022-02-21 DIAGNOSIS — I10 Essential (primary) hypertension: Secondary | ICD-10-CM | POA: Diagnosis not present

## 2022-02-21 DIAGNOSIS — Z23 Encounter for immunization: Secondary | ICD-10-CM | POA: Diagnosis not present

## 2022-02-21 DIAGNOSIS — R7303 Prediabetes: Secondary | ICD-10-CM | POA: Diagnosis not present

## 2022-02-21 DIAGNOSIS — K219 Gastro-esophageal reflux disease without esophagitis: Secondary | ICD-10-CM | POA: Diagnosis not present

## 2022-02-21 DIAGNOSIS — E559 Vitamin D deficiency, unspecified: Secondary | ICD-10-CM | POA: Diagnosis not present

## 2022-02-21 DIAGNOSIS — E785 Hyperlipidemia, unspecified: Secondary | ICD-10-CM | POA: Diagnosis not present

## 2022-02-21 DIAGNOSIS — M9983 Other biomechanical lesions of lumbar region: Secondary | ICD-10-CM | POA: Diagnosis not present

## 2022-02-21 DIAGNOSIS — J302 Other seasonal allergic rhinitis: Secondary | ICD-10-CM | POA: Diagnosis not present

## 2022-03-13 DIAGNOSIS — R609 Edema, unspecified: Secondary | ICD-10-CM | POA: Diagnosis not present

## 2022-03-13 DIAGNOSIS — I10 Essential (primary) hypertension: Secondary | ICD-10-CM | POA: Diagnosis not present

## 2022-03-13 DIAGNOSIS — R944 Abnormal results of kidney function studies: Secondary | ICD-10-CM | POA: Diagnosis not present

## 2022-03-13 DIAGNOSIS — I480 Paroxysmal atrial fibrillation: Secondary | ICD-10-CM | POA: Diagnosis not present

## 2022-03-14 DIAGNOSIS — I10 Essential (primary) hypertension: Secondary | ICD-10-CM | POA: Diagnosis not present

## 2022-03-14 DIAGNOSIS — I480 Paroxysmal atrial fibrillation: Secondary | ICD-10-CM | POA: Diagnosis not present

## 2022-03-26 DIAGNOSIS — L578 Other skin changes due to chronic exposure to nonionizing radiation: Secondary | ICD-10-CM | POA: Diagnosis not present

## 2022-03-26 DIAGNOSIS — W908XXS Exposure to other nonionizing radiation, sequela: Secondary | ICD-10-CM | POA: Diagnosis not present

## 2022-03-26 DIAGNOSIS — L821 Other seborrheic keratosis: Secondary | ICD-10-CM | POA: Diagnosis not present

## 2022-03-26 DIAGNOSIS — Z859 Personal history of malignant neoplasm, unspecified: Secondary | ICD-10-CM | POA: Diagnosis not present

## 2022-03-26 DIAGNOSIS — Z85828 Personal history of other malignant neoplasm of skin: Secondary | ICD-10-CM | POA: Diagnosis not present

## 2022-03-26 DIAGNOSIS — D0462 Carcinoma in situ of skin of left upper limb, including shoulder: Secondary | ICD-10-CM | POA: Diagnosis not present

## 2022-03-26 DIAGNOSIS — Z86008 Personal history of in-situ neoplasm of other site: Secondary | ICD-10-CM | POA: Diagnosis not present

## 2022-04-10 NOTE — H&P (Signed)
Patient's anticipated LOS is less than 2 midnights, meeting these requirements: - Younger than 26 - Lives within 1 hour of care - Has a competent adult at home to recover with post-op recover - NO history of  - Chronic pain requiring opiods  - Diabetes  - Coronary Artery Disease  - Heart failure  - Heart attack  - Stroke  - DVT/VTE  - Cardiac arrhythmia  - Respiratory Failure/COPD  - Renal failure  - Anemia  - Advanced Liver disease     [name] is an 88 y.o. female.    Chief Complaint: right shoulder pain  HPI: Pt is a 87 y.o. female complaining of right shoulder pain for multiple years. Pain had continually increased since the beginning. X-rays in the clinic show end-stage arthritic changes of the right shoulder. Pt has tried various conservative treatments which have failed to alleviate their symptoms, including injections and therapy. Various options are discussed with the patient. Risks, benefits and expectations were discussed with the patient. Patient understand the risks, benefits and expectations and wishes to proceed with surgery.   PCP:  Briscoe Deutscher, MD  D/C Plans: Home  PMH: Past Medical History:  Diagnosis Date   Arthritis    Cancer [employer])    skin cancer moles removed 7124   Complication of anesthesia    hyper ventilating and spinal did not work june 05-2102   GERD (gastroesophageal reflux disease)    Heart murmur    mild, no cardiologist   Hypertension     PSH: Past Surgical History:  Procedure Laterality Date   both eyes lens replacements for cataracts dec 2012     [place] or 1964   left shoulder replacement  3-4- yrs ago   pre skin cancer areas removed from left middle finger and right  hand and right leg  yrs ago   tear duct surgery both eyes  18-20 yrs ago   TOTAL KNEE ARTHROPLASTY  01/21/2012   Procedure: TOTAL KNEE ARTHROPLASTY;  Surgeon: Mauri Pole, MD;  Location: [facility];  Service:  Orthopedics;  Laterality: Left;  started as spinal, converted to general intraop.   TOTAL KNEE ARTHROPLASTY  05/12/2012   Procedure: TOTAL KNEE ARTHROPLASTY;  Surgeon: Mauri Pole, MD;  Location: [facility];  Service: Orthopedics;  Laterality: Right;  Spinal converted to LMA    Social History:  reports that she has never smoked. She has never used smokeless tobacco. She reports that she does not drink alcohol and does not use drugs. BMI: Estimated body mass index is 36.11 kg/m as calculated from the following:   Height as of 05/12/12: 5' 0.5" (1.537 m).   Weight as of 05/12/12: 85.3 kg.  No results found for: "ALBUMIN" Diabetes: Patient does not have a diagnosis of diabetes.     Smoking Status:   reports that she has never smoked. She has never used smokeless tobacco.    Allergies:  No Known Allergies  Medications: No current facility-administered medications for this encounter.   Current Outpatient Medications  Medication Sig Dispense Refill   aspirin EC 325 MG tablet Take 1 tablet (325 mg total) by mouth 2 (two) times daily. X 4 weeks 60 tablet 0   calcium-vitamin D (OSCAL WITH D) 500-200 MG-UNIT per tablet Take 1 tablet by mouth daily.     celecoxib (CELEBREX) 200 MG capsule Take 1 capsule (200 mg total) by mouth every 12 (twelve) hours.  diphenhydrAMINE (BENADRYL) 25 mg capsule Take 1 capsule (25 mg total) by mouth every 6 (six) hours as needed for itching, allergies or sleep. 30 capsule    docusate sodium 100 MG CAPS Take 100 mg by mouth 2 (two) times daily. 10 capsule    DULoxetine (CYMBALTA) 60 MG capsule Take 60 mg by mouth 2 (two) times daily.     ferrous sulfate 325 (65 FE) MG tablet Take 1 tablet (325 mg total) by mouth 3 (three) times daily after meals.     HYDROcodone-acetaminophen (NORCO) 7.5-325 MG per tablet Take 1-2 tablets by mouth every 4 (four) hours as needed for pain. 120 tablet 0   losartan-hydrochlorothiazide (HYZAAR) 100-25 MG per tablet Take 1 tablet by  mouth daily with breakfast.      methocarbamol (ROBAXIN) 500 MG tablet Take 1 tablet (500 mg total) by mouth every 6 (six) hours as needed (muscle spasms).     Multiple Vitamin (MULITIVITAMIN WITH MINERALS) TABS Take 1 tablet by mouth daily.     omeprazole (PRILOSEC) 20 MG capsule Take 20 mg by mouth every morning.      polyethylene glycol (MIRALAX / GLYCOLAX) packet Take 17 g by mouth 2 (two) times daily. 14 each    Vitamin D, Ergocalciferol, (DRISDOL) 50000 UNITS CAPS Take 50,000 Units by mouth every 7 (seven) days. Takes on Tuesdays.     zolpidem (AMBIEN) 10 MG tablet Take 10 mg by mouth at bedtime as needed. For insomnia.      No results found for this or any previous visit (from the past 48 hour(s)). No results found.  ROS: Pain with rom of the right upper extremity  Physical Exam: Alert and oriented 88 y.o. female in no acute distress Cranial nerves 2-12 intact Cervical spine: full rom with no tenderness, nv intact distally Chest: active breath sounds bilaterally, no wheeze rhonchi or rales Heart: regular rate and rhythm, no murmur Abd: non tender non distended with active bowel sounds Hip is stable with rom  Right shoulder painful and weak rom Nv intact distally No rashes or edema   Assessment/Plan Assessment: right shoulder cuff arthropathy  Plan:  Patient will undergo a right reverse total shoulder by Dr. Veverly Fells at [place] Risks benefits and expectations were discussed with the patient. Patient understand risks, benefits and expectations and wishes to proceed. Preoperative templating of the joint replacement has been completed, documented, and submitted to the Operating Room personnel in order to optimize intra-operative equipment management.   [name] PA-C, MPAS [facility] is now [employer] [address] [place], [place], [place] [address] Phone: [phone] [email or web address]  [employer]

## 2022-04-11 ENCOUNTER — Encounter (HOSPITAL_COMMUNITY): Payer: Self-pay

## 2022-04-11 NOTE — Patient Instructions (Addendum)
SURGICAL WAITING ROOM VISITATION Patients having surgery or a procedure may have no more than 2 support people in the waiting area - these visitors may rotate.   Children under the age of 71 must have an adult with them who is not the patient. If the patient needs to stay at the hospital during part of their recovery, the visitor guidelines for inpatient rooms apply. Pre-op nurse will coordinate an appropriate time for 1 support person to accompany patient in pre-op.  This support person may not rotate.    Please refer to the [facility] website for the visitor guidelines for Inpatients (after your surgery is over and you are in a regular room).       Your procedure is scheduled on: Friday, Sept. 8, 2023   Report to [facility] Main Entrance    Report to admitting at 7:15 AM   Call this number if you have problems the morning of surgery [phone]   Do not eat food :After Midnight.   After Midnight you may have the following liquids until 6:45 AM DAY OF SURGERY  Water Non-Citrus Juices (without pulp, NO RED) Carbonated Beverages Black Coffee (NO MILK/CREAM OR CREAMERS, sugar ok)  Clear Tea (NO MILK/CREAM OR CREAMERS, sugar ok) regular and decaf                             Plain Jell-O (NO RED)                                           Fruit ices (not with fruit pulp, NO RED)                                     Popsicles (NO RED)                                                               Sports drinks like Gatorade (NO RED)                   The day of surgery:  Drink ONE (1) Pre-Surgery Clear Ensure 6:45 AM the morning of surgery. Drink in one sitting. Do not sip.  This drink was given to you during your hospital  pre-op appointment visit. Nothing else to drink after completing the  Pre-Surgery Clear Ensure.          If you have questions, please contact your surgeon's office.   FOLLOW BOWEL PREP AND ANY ADDITIONAL PRE OP INSTRUCTIONS YOU RECEIVED FROM YOUR  SURGEON'S OFFICE!!!     Oral Hygiene is also important to reduce your risk of infection.                                    Remember - BRUSH YOUR TEETH THE MORNING OF SURGERY WITH YOUR REGULAR TOOTHPASTE   Do NOT smoke after Midnight   Take these medicines the morning of surgery with A SIP OF WATER: Docusate, Duloxetine, Omeprazole, Hydrocodone if needed  and can tolerate on an empty stomach, Methocarbamol if needed  Bring CPAP mask and tubing day of surgery.                              You may not have any metal on your body including hair pins, jewelry, and body piercing             Do not wear make-up, lotions, powders, perfumes/cologne, or deodorant  Do not wear nail polish including gel and S&S, artificial/acrylic nails, or any other type of covering on natural nails including finger and toenails. If you have artificial nails, gel coating, etc. that needs to be removed by a nail salon please have this removed prior to surgery or surgery may need to be canceled/ delayed if the surgeon/ anesthesia feels like they are unable to be safely monitored.   Do not shave  48 hours prior to surgery.             Do not bring valuables to the hospital. [place].   Contacts, dentures or bridgework may not be worn into surgery.   Bring small overnight bag day of surgery.   DO NOT [place]. PHARMACY WILL DISPENSE MEDICATIONS LISTED ON YOUR MEDICATION LIST TO YOU DURING YOUR ADMISSION [place]!    Patients discharged on the day of surgery will not be allowed to drive home.  Someone NEEDS to stay with you for the first 24 hours after anesthesia.   Special Instructions: Bring a copy of your healthcare power of attorney and living will documents         the day of surgery if you haven't scanned them before.              Please read over the following fact sheets you were given: IF YOU HAVE QUESTIONS ABOUT  YOUR PRE-OP INSTRUCTIONS PLEASE CALL [place]- Preparing for Total Shoulder Arthroplasty    Before surgery, you can play an important role. Because skin is not sterile, your skin needs to be as free of germs as possible. You can reduce the number of germs on your skin by using the following products. Benzoyl Peroxide Gel Reduces the number of germs present on the skin Applied twice a day to shoulder area starting two days before surgery    ==================================================================  Please follow these instructions carefully:  BENZOYL PEROXIDE 5% GEL  Please do not use if you have an allergy to benzoyl peroxide.   If your skin becomes reddened/irritated stop using the benzoyl peroxide.  Starting two days before surgery, apply as follows: Wednesday Sept. 6, 2023 and Thursday, Sept. 7, 2023 Apply benzoyl peroxide in the morning and at night. Apply after taking a shower. If you are not taking a shower clean entire shoulder front, back, and side along with the armpit with a clean wet washcloth.  Place a quarter-sized dollop on your shoulder and rub in thoroughly, making sure to cover the front, back, and side of your shoulder, along with the armpit.   2 days before ____ AM   ____ PM              1 day before ____ AM   ____ PM  Do this twice a day for two days.  (Last application is the night before surgery, AFTER using the CHG soap as described below).  Do NOT apply benzoyl peroxide gel on the day of surgery.   Incentive Spirometer (Watch this video at home: [email or web address])  An incentive spirometer is a tool that can help keep your lungs clear and active. This tool measures how well you are filling your lungs with each breath. Taking long deep breaths may help reverse or decrease the chance of developing breathing (pulmonary) problems (especially infection) following: A long period of time when  you are unable to move or be active. BEFORE THE PROCEDURE  If the spirometer includes an indicator to show your best effort, your nurse or respiratory therapist will set it to a desired goal. If possible, sit up straight or lean slightly forward. Try not to slouch. Hold the incentive spirometer in an upright position. INSTRUCTIONS FOR USE  Sit on the edge of your bed if possible, or sit up as far as you can in bed or on a chair. Hold the incentive spirometer in an upright position. Breathe out normally. Place the mouthpiece in your mouth and seal your lips tightly around it. Breathe in slowly and as deeply as possible, raising the piston or the ball toward the top of the column. Hold your breath for 3-5 seconds or for as long as possible. Allow the piston or ball to fall to the bottom of the column. Remove the mouthpiece from your mouth and breathe out normally. Rest for a few seconds and repeat Steps 1 through 7 at least 10 times every 1-2 hours when you are awake. Take your time and take a few normal breaths between deep breaths. The spirometer may include an indicator to show your best effort. Use the indicator as a goal to work toward during each repetition. After each set of 10 deep breaths, practice coughing to be sure your lungs are clear. If you have an incision (the cut made at the time of surgery), support your incision when coughing by placing a pillow or rolled up towels firmly against it. Once you are able to get out of bed, walk around indoors and cough well. You may stop using the incentive spirometer when instructed by your caregiver.  RISKS AND COMPLICATIONS Take your time so you do not get dizzy or light-headed. If you are in pain, you may need to take or ask for pain medication before doing incentive spirometry. It is harder to take a deep breath if you are having pain. AFTER USE Rest and breathe slowly and easily. It can be helpful to keep track of a log of your progress.  Your caregiver can provide you with a simple table to help with this. If you are using the spirometer at home, follow these instructions: [place] IF:  You are having difficultly using the spirometer. You have trouble using the spirometer as often as instructed. Your pain medication is not giving enough relief while using the spirometer. You develop fever of 100.5 F (38.1 C) or higher. SEEK IMMEDIATE MEDICAL CARE IF:  You cough up bloody sputum that had not been present before. You develop fever of 102 F (38.9 C) or greater. You develop worsening pain at or near the incision site. MAKE SURE YOU:  Understand these instructions. Will watch your condition. Will get help right away if you are not doing well or get worse. Document Released: 12/10/2006 Document Revised: 10/22/2011 Document Reviewed: 02/10/2007  ExitCare Patient Information 2014 [place].

## 2022-04-11 NOTE — Progress Notes (Signed)
For Short Stay: [place] appointment date: Date of COVID positive in last 73 days:  Bowel Prep reminder:   For Anesthesia: PCP - Dr. Briscoe Deutscher Cardiologist - Dr. Theotis Barrio Last office visit 03/13/22 in care everywhere Neurologist- DR. Glenice Bow Last office visit 08/12/20 in care everywhere  Chest x-ray -  EKG - 03/13/22 in care everywhere (requested tracing) Stress Test -  ECHO - 03/14/22 in care everywhere Cardiac Cath -  Pacemaker/ICD device last checked: Pacemaker orders received: Device Rep notified:  Spinal Cord Stimulator:  Sleep Study -  CPAP -   Fasting Blood Sugar -  Checks Blood Sugar _____ times a day Date and result of last Hgb A1c-  Blood Thinner Instructions: Aspirin Instructions: Last Dose:  Activity level: Can go up a flight of stairs and activities of daily living without stopping and without chest pain and/or shortness of breath   Able to exercise without chest pain and/or shortness of breath   Unable to go up a flight of stairs without chest pain and/or shortness of breath     Anesthesia review: PAF, CVA, HTN  Patient denies shortness of breath, fever, cough and chest pain at PAT appointment   Patient verbalized understanding of instructions that were given to them at the PAT appointment. Patient was also instructed that they will need to review over the PAT instructions again at home before surgery.

## 2022-04-12 ENCOUNTER — Encounter (HOSPITAL_COMMUNITY)
Admission: RE | Admit: 2022-04-12 | Discharge: 2022-04-12 | Disposition: A | Discharge status: Home / Self Care | Payer: Medicare HMO | Source: Ambulatory Visit | Attending: Orthopedic Surgery | Admitting: Orthopedic Surgery

## 2022-04-12 ENCOUNTER — Encounter (HOSPITAL_COMMUNITY): Payer: Self-pay

## 2022-04-12 ENCOUNTER — Other Ambulatory Visit: Payer: Self-pay

## 2022-04-12 VITALS — BP 141/81 | HR 99 | Temp 97.6°F | Ht 60.0 in | Wt 139.0 lb

## 2022-04-12 DIAGNOSIS — I480 Paroxysmal atrial fibrillation: Secondary | ICD-10-CM | POA: Insufficient documentation

## 2022-04-12 DIAGNOSIS — Z7982 Long term (current) use of aspirin: Secondary | ICD-10-CM | POA: Diagnosis not present

## 2022-04-12 DIAGNOSIS — Z01812 Encounter for preprocedural laboratory examination: Secondary | ICD-10-CM | POA: Insufficient documentation

## 2022-04-12 DIAGNOSIS — Z79899 Other long term (current) drug therapy: Secondary | ICD-10-CM | POA: Insufficient documentation

## 2022-04-12 DIAGNOSIS — I10 Essential (primary) hypertension: Secondary | ICD-10-CM | POA: Diagnosis not present

## 2022-04-12 DIAGNOSIS — I2510 Atherosclerotic heart disease of native coronary artery without angina pectoris: Secondary | ICD-10-CM | POA: Insufficient documentation

## 2022-04-12 DIAGNOSIS — Z8673 Personal history of transient ischemic attack (TIA), and cerebral infarction without residual deficits: Secondary | ICD-10-CM | POA: Insufficient documentation

## 2022-04-12 DIAGNOSIS — K219 Gastro-esophageal reflux disease without esophagitis: Secondary | ICD-10-CM | POA: Diagnosis not present

## 2022-04-12 DIAGNOSIS — Z01818 Encounter for other preprocedural examination: Secondary | ICD-10-CM

## 2022-04-12 DIAGNOSIS — M19011 Primary osteoarthritis, right shoulder: Secondary | ICD-10-CM | POA: Diagnosis not present

## 2022-04-12 HISTORY — DX: Cerebral infarction, unspecified: I63.9

## 2022-04-12 HISTORY — DX: Paroxysmal atrial fibrillation: I48.0

## 2022-04-12 LAB — SURGICAL PCR SCREEN
MRSA, PCR: NEGATIVE
Staphylococcus aureus: POSITIVE — AB

## 2022-04-12 LAB — BASIC METABOLIC PANEL
Anion gap: 8 (ref 5–15)
BUN: 19 mg/dL (ref 8–23)
CO2: 28 mmol/L (ref 22–32)
Calcium: 11 mg/dL — ABNORMAL HIGH (ref 8.9–10.3)
Chloride: 111 mmol/L (ref 98–111)
Creatinine, Ser: 0.98 mg/dL (ref 0.44–1.00)
GFR, Estimated: 56 mL/min — ABNORMAL LOW (ref >60)
Glucose, Bld: 111 mg/dL — ABNORMAL HIGH (ref 70–99)
Potassium: 3.9 mmol/L (ref 3.5–5.1)
Sodium: 147 mmol/L — ABNORMAL HIGH (ref 135–145)

## 2022-04-12 LAB — CBC
HCT: 39.6 % (ref 36.0–46.0)
Hemoglobin: 12.4 g/dL (ref 12.0–15.0)
MCH: 29.2 pg (ref 26.0–34.0)
MCHC: 31.3 g/dL (ref 30.0–36.0)
MCV: 93.4 fL (ref 80.0–100.0)
Platelets: 207 10*3/uL (ref 150–400)
RBC: 4.24 MIL/uL (ref 3.87–5.11)
RDW: 13.8 % (ref 11.5–15.5)
WBC: 6.4 10*3/uL (ref 4.0–10.5)
nRBC: 0 % (ref 0.0–0.2)

## 2022-04-12 NOTE — Progress Notes (Signed)
Lab. Results: HCT: 29.5 PCR: + STAPH.

## 2022-04-13 ENCOUNTER — Encounter (HOSPITAL_COMMUNITY): Payer: Self-pay | Admitting: Physician Assistant

## 2022-04-13 NOTE — Progress Notes (Signed)
Anesthesia Chart Review   Case: [id number] Date/Time: 04/20/22 0935   Procedure: REVERSE SHOULDER ARTHROPLASTY (Right: Shoulder) - with ISB   Anesthesia type: General   Pre-op diagnosis: Osteoarthritis of joint of right shoulder region   Location: [place] 06 / [facility]   Surgeons: Netta Cedars, MD       DISCUSSION:88 y.o.e never smoker with h/o HTN, PAF, stroke, right shoulder OA scheduled for above procedure 04/20/2022 with Dr. Netta Cedars.   Pt followed by cardiology for PAF and HTN.  Last seen 03/13/2022, stable at this visit.   EKG on chart.   Anticipate pt can proceed with planned procedure barring acute status change.   VS: BP (!) 141/81   Pulse 99   Temp 36.4 C (Oral)   Ht 5' (1.524 m)   Wt 63 kg   SpO2 96%   BMI 27.15 kg/m   PROVIDERS: [facility], Chrissie Noa, MD is Cardiologist  LABS: Labs reviewed: Acceptable for surgery. (all labs ordered are listed, but only abnormal results are displayed)  Labs Reviewed  SURGICAL PCR SCREEN - Abnormal; Notable for the following components:      Result Value   Staphylococcus aureus POSITIVE (*)    All other components within normal limits  BASIC METABOLIC PANEL - Abnormal; Notable for the following components:   Sodium 147 (*)    Glucose, Bld 111 (*)    Calcium 11.0 (*)    GFR, Estimated 56 (*)    All other components within normal limits  CBC     IMAGES:   EKG:   CV: Echo 03/14/2022 Left Ventricle: Systolic function is normal. EF: 60-65%.    Left Ventricle: Wall thickness is normal.    Left Ventricle: No regional wall motion abnormalities noted.    Left Ventricle: Doppler parameters consistent with mild diastolic  dysfunction and low to normal LA pressure.    Left Atrium: Left atrium size is normalLeft atrium volume index is  normal (16-34 mL/m2). Past Medical History:  Diagnosis Date   Arthritis    Cancer ([place])    skin cancer moles removed [phone]   Complication of anesthesia     hyper ventilating and spinal did not work june 05-2102   GERD (gastroesophageal reflux disease)    Heart murmur    mild, no cardiologist   Hypertension    PAF (paroxysmal atrial fibrillation) ([place])    Stroke ([place])    right inferior occipital lobe infarct treated with Aspirin    Past Surgical History:  Procedure Laterality Date   both eyes lens replacements for cataracts dec 2012     [place] or 1964   left shoulder replacement  3-4- yrs ago   pre skin cancer areas removed from left middle finger and right  hand and right leg  yrs ago   tear duct surgery both eyes  18-20 yrs ago   TOTAL KNEE ARTHROPLASTY  01/21/2012   Procedure: TOTAL KNEE ARTHROPLASTY;  Surgeon: Mauri Pole, MD;  Location: [facility];  Service: Orthopedics;  Laterality: Left;  started as spinal, converted to general intraop.   TOTAL KNEE ARTHROPLASTY  05/12/2012   Procedure: TOTAL KNEE ARTHROPLASTY;  Surgeon: Mauri Pole, MD;  [facility];  Service: Orthopedics;  Laterality: Right;  Spinal converted to LMA    MEDICATIONS:  amLODipine (NORVASC) 5 MG tablet   aspirin EC 325 MG tablet   atorvastatin (LIPITOR) 40 MG tablet  b complex vitamins capsule   calcium-vitamin D (OSCAL WITH D) 500-200 MG-UNIT per tablet   carboxymethylcellulose (REFRESH PLUS) 0.5 % SOLN   DULoxetine (CYMBALTA) 60 MG capsule   hydrALAZINE (APRESOLINE) 25 MG tablet   losartan (COZAAR) 100 MG tablet   Multiple Vitamin (MULITIVITAMIN WITH MINERALS) TABS   omeprazole (PRILOSEC) 20 MG capsule   No current facility-administered medications for this encounter.    Konrad Felix Ward, PA-C [facility] Pre-Surgical Testing [phone]

## 2022-04-20 ENCOUNTER — Encounter (HOSPITAL_COMMUNITY): Admission: RE | Payer: Self-pay | Source: Home / Self Care

## 2022-04-20 ENCOUNTER — Ambulatory Visit (HOSPITAL_COMMUNITY): Admission: RE | Admit: 2022-04-20 | Payer: Medicare HMO | Source: Home / Self Care | Admitting: Orthopedic Surgery

## 2022-04-20 DIAGNOSIS — M19011 Primary osteoarthritis, right shoulder: Secondary | ICD-10-CM | POA: Diagnosis not present

## 2022-04-20 DIAGNOSIS — Z01818 Encounter for other preprocedural examination: Secondary | ICD-10-CM

## 2022-04-20 DIAGNOSIS — M25511 Pain in right shoulder: Secondary | ICD-10-CM | POA: Diagnosis not present

## 2022-04-20 SURGERY — ARTHROPLASTY, SHOULDER, TOTAL, REVERSE
Anesthesia: General | Site: Shoulder | Laterality: Right

## 2022-04-24 ENCOUNTER — Encounter (HOSPITAL_COMMUNITY): Payer: Medicare Other

## 2022-04-27 DIAGNOSIS — M19011 Primary osteoarthritis, right shoulder: Secondary | ICD-10-CM | POA: Diagnosis not present

## 2022-04-27 DIAGNOSIS — M25511 Pain in right shoulder: Secondary | ICD-10-CM | POA: Diagnosis not present

## 2022-06-06 NOTE — H&P (Addendum)
Patient's anticipated LOS is less than 2 midnights, meeting these requirements: - Younger than 9 - Lives within 1 hour of care - Has a competent adult at home to recover with post-op recover - NO history of  - Chronic pain requiring opiods  - Diabetes  - Coronary Artery Disease  - Heart failure  - Heart attack  - Stroke  - DVT/VTE  - Cardiac arrhythmia  - Respiratory Failure/COPD  - Renal failure  - Anemia  - Advanced Liver disease     [name] is an 88 y.o. female.    Chief Complaint: right shoulder pain  HPI: Pt is a 88 y.o. female complaining of right shoulder pain for multiple years. Pain had continually increased since the beginning. X-rays in the clinic show end-stage arthritic changes of the right shoulder. Pt has tried various conservative treatments which have failed to alleviate their symptoms, including injections and therapy. Various options are discussed with the patient. Risks, benefits and expectations were discussed with the patient. Patient understand the risks, benefits and expectations and wishes to proceed with surgery.   PCP:  [place], [place]  D/C Plans: Home  PMH: Past Medical History:  Diagnosis Date   Arthritis    Cancer [facility])    skin cancer moles removed [phone]   Complication of anesthesia    hyper ventilating and spinal did not work june 05-2102   GERD (gastroesophageal reflux disease)    Heart murmur    mild, no cardiologist   Hypertension    PAF (paroxysmal atrial fibrillation) ([place])    Stroke ([place])    right inferior occipital lobe infarct treated with Aspirin    PSH: Past Surgical History:  Procedure Laterality Date   both eyes lens replacements for cataracts dec 2012     [place] or 1964   left shoulder replacement  3-4- yrs ago   pre skin cancer areas removed from left middle finger and right  hand and right leg  yrs ago   tear duct surgery both eyes  18-20 yrs ago    TOTAL KNEE ARTHROPLASTY  01/21/2012   Procedure: TOTAL KNEE ARTHROPLASTY;  Surgeon: Mauri Pole, MD;  [facility];  Service: Orthopedics;  Laterality: Left;  started as spinal, converted to general intraop.   TOTAL KNEE ARTHROPLASTY  05/12/2012   Procedure: TOTAL KNEE ARTHROPLASTY;  Surgeon: Mauri Pole, MD;  [facility];  Service: Orthopedics;  Laterality: Right;  Spinal converted to LMA    Social History:  reports that she has never smoked. She has never used smokeless tobacco. She reports that she does not drink alcohol and does not use drugs. BMI: Estimated body mass index is 27.15 kg/m as calculated from the following:   Height as of 04/12/22: 5' (1.524 m).   Weight as of 04/12/22: 63 kg.  No results found for: "ALBUMIN" Diabetes: Patient does not have a diagnosis of diabetes.     Smoking Status:   reports that she has never smoked. She has never used smokeless tobacco.    Allergies:  No Known Allergies  Medications: No current facility-administered medications for this encounter.   Current Outpatient Medications  Medication Sig Dispense Refill   amLODipine (NORVASC) 5 MG tablet Take 5 mg by mouth daily.     aspirin EC 325 MG tablet Take 325 mg by mouth daily.     atorvastatin (LIPITOR) 40 MG tablet Take 40 mg by mouth  at bedtime.     b complex vitamins capsule Take 1 capsule by mouth daily.     calcium-vitamin D (OSCAL WITH D) 500-200 MG-UNIT per tablet Take 1 tablet by mouth daily.     carboxymethylcellulose (REFRESH PLUS) 0.5 % SOLN Place 1 drop into both eyes 3 (three) times daily as needed (dry eyes).     DULoxetine (CYMBALTA) 60 MG capsule Take 60 mg by mouth daily.     hydrALAZINE (APRESOLINE) 25 MG tablet Take 25 mg by mouth 3 (three) times daily.     losartan (COZAAR) 100 MG tablet Take 100 mg by mouth daily.     Multiple Vitamin (MULITIVITAMIN WITH MINERALS) TABS Take 1 tablet by mouth daily.     omeprazole (PRILOSEC) 20 MG capsule Take 20 mg by  mouth every morning.       No results found for this or any previous visit (from the past 48 hour(s)). No results found.  ROS: Pain with rom of the right upper extremity  Physical Exam: Alert and oriented 88 y.o. female in no acute distress Cranial nerves 2-12 intact Cervical spine: full rom with no tenderness, nv intact distally Chest: active breath sounds bilaterally, no wheeze rhonchi or rales Heart: regular rate and rhythm, no murmur Abd: non tender non distended with active bowel sounds Hip is stable with rom  Right shoulder pain and weakness with rom Nv intact distally No rashes or edema distally  Assessment/Plan Assessment: right shoulder cuff arthropathy  Plan:  Patient will undergo a right reverse total shoulder by Dr. Veverly Fells at [place] [facility] benefits and expectations were discussed with the patient. Patient understand risks, benefits and expectations and wishes to proceed. Preoperative templating of the joint replacement has been completed, documented, and submitted to the Operating Room personnel in order to optimize intra-operative equipment management.   [name] PA-C, MPAS [facility] is now [employer] [address] [place], [place], [place] [address] Phone: [phone] [email or web address]  [employer]

## 2022-06-11 NOTE — Progress Notes (Addendum)
This surgery was originally posted as a LEFT Reverse Shoulder but the patient said that it should be her RIGHT shoulder. I called Megan in Dr. Ranell Patrick' office and she verified that the patient was correct;  Right Reverse Total shoulder arthroplasty. Aundra Millet will make all changes necessary and will notify Standley Dakins, PA to change his H&P to the correct side. The patient signed the consent with the correct side (Right) listed.  Reported as SZP Event # [id number].  COVID Vaccine received:  []  No [x]  Yes Date of any COVID positive Test in last 90 days: none  PCP -  [facility]  Cardiologist - Leron Croak, MD-  Cardiac Clearance- 03-13-22 note in [facility]  at [facility] [address] [place] [address]   Phone: [phone]   Fax: [phone]    Chest x-ray -n /a EKG -  03/13/22   [facility]   Requested fax of tracing from [place], Dr. Boneta Lucks Stress Test - n/a ECHO - 03-14-22  [facility] Cardiac Cath - n/a  PCR screen: [x]  Ordered & Completed                      []   Not Ordered but Needs PROFEND                      []   N/A for this surgery  Surgery Plan:  []  Ambulatory  [x]  Outpatient in bed  []  Admit Anesthesia:    []  General  []  Spinal  [x]   Choice  Pacemaker/ICD device     [x]  N/A Spinal Cord Stimulator:[x]  No []  Yes      History of Sleep Apnea? [x]  No []  Yes   Sleep Study Date:   CPAP used?- [x]  No []  Yes  (Instruct to bring their mask & Tubing)  Does the patient monitor blood sugar? []  No []  Yes  [x]  N/A  Blood Thinner Instructions: Aspirin Instructions:  ASA 325 mg- hold 5-7 days Last Dose: Saturday 06-16-2022  ERAS Protocol Ordered: []  No  [x]  Yes PRE-SURGERY [x]  ENSURE  []  G2  []  No Drink Ordered  Comments: The patient was given Benzoyl peroxide Gel as ordered. Instruction regarding application starting 2 days prior to surgery was given and patient voiced understanding.   Activity level: Patient can climb a  flight of stairs without difficulty; [x]  No CP  [x]  No SOB Patient can perform ADLs without assistance.   Anesthesia review: HTN, A.FIB, Hx hyperventilating and Spinal didn't work in the past, Mild Murmur, Hx CVA- Right inferior occipital lobe infarct, CKD2  Patient denies shortness of breath, fever, cough and chest pain at PAT appointment.  Patient verbalized understanding and agreement to the Pre-Surgical Instructions that were given to them at this PAT appointment. Patient was also educated of the need to review these PAT instructions again prior to his/her surgery.I reviewed the appropriate phone numbers to call if they have any and questions or concerns.

## 2022-06-11 NOTE — Patient Instructions (Signed)
SURGICAL WAITING ROOM VISITATION Patients having surgery or a procedure may have no more than 2 support people in the waiting area - these visitors may rotate in the visitor waiting room.   Children under the age of 83 must have an adult with them who is not the patient. If the patient needs to stay at the hospital during part of their recovery, the visitor guidelines for inpatient rooms apply.  PRE-OP VISITATION  Pre-op nurse will coordinate an appropriate time for 1 support person to accompany the patient in pre-op.  This support person may not rotate.  This visitor will be contacted when the time is appropriate for the visitor to come back in the pre-op area.  Please refer to the [facility] website for the visitor guidelines for Inpatients (after your surgery is over and you are in a regular room).  You are not required to quarantine at this time prior to your surgery. However, you must do this: Hand Hygiene often Do NOT share personal items Notify your provider if you are in close contact with someone who has COVID or you develop fever 100.4 or greater, new onset of sneezing, cough, sore throat, shortness of breath or body aches.  If you test positive for Covid or have been in contact with anyone that has tested positive in the last 10 days please notify you surgeon.    Your procedure is scheduled on:  Friday  June 22, 2022  Report to [facility] Main Entrance: [place] entrance where the [employer] is available.   Report to admitting at: 10:00    AM  +++++Call this number if you have any questions or problems the morning of surgery [phone]  Do not eat food after Midnight the night prior to your surgery/procedure.  After Midnight you may have the following liquids until   09:30  AM DAY OF SURGERY  Clear Liquid Diet Water Black Coffee (sugar ok, NO MILK/CREAM OR CREAMERS)  Tea (sugar ok, NO MILK/CREAM OR CREAMERS) regular and decaf                              Plain Jell-O  with no fruit (NO RED)                                           Fruit ices (not with fruit pulp, NO RED)                                     Popsicles (NO RED)                                                                  Juice: apple, WHITE grape, WHITE cranberry Sports drinks like Gatorade or Powerade (NO RED)                   The day of surgery:  Drink ONE (1) Pre-Surgery Clear Ensure at   09:30   AM the morning of surgery. Drink in one sitting. Do not sip.  This drink was given to you during your hospital pre-op appointment visit. Nothing else to drink after completing the Pre-Surgery Clear Ensure : No candy, chewing gum or throat lozenges.    FOLLOW ANY ADDITIONAL PRE OP INSTRUCTIONS YOU RECEIVED FROM YOUR SURGEON'S OFFICE!!!   Oral Hygiene is also important to reduce your risk of infection.        Remember - BRUSH YOUR TEETH THE MORNING OF SURGERY WITH YOUR REGULAR TOOTHPASTE  Do NOT smoke after Midnight the night before surgery.  Take ONLY these medicines the morning of surgery with A SIP OF WATER: Duloxetine (Cymbalta), Amlodipine and Omeprazole.                     You may not have any metal on your body including hair pins, jewelry, and body piercing  Do not wear make-up, lotions, powders, perfumes  or deodorant  Do not wear nail polish including gel and S&S, artificial / acrylic nails, or any other type of covering on natural nails including finger and toenails. If you have artificial nails, gel coating, etc., that needs to be removed by a nail salon, Please have this removed prior to surgery. Not doing so may mean that your surgery could be cancelled or delayed if the Surgeon or anesthesia staff feels like they are unable to monitor you safely.   Do not shave 48 hours prior to surgery to avoid nicks in your skin which may contribute to postoperative infections.   Contacts, Hearing Aids, dentures or bridgework may not be worn into surgery.   You may  bring a small overnight bag with you on the day of surgery, only pack items that are not valuable .[employer] IS NOT RESPONSIBLE   FOR VALUABLES THAT ARE LOST OR STOLEN.   Special Instructions: Bring a copy of your healthcare power of attorney and living will documents the day of surgery, if you wish to have them scanned into your [place] [facility] Records- EPIC  Please read over the following fact sheets you were given: IF YOU HAVE QUESTIONS ABOUT YOUR PRE-OP INSTRUCTIONS, PLEASE CALL [phone]  ([place])   [facility] - Preparing for Surgery Before surgery, you can play an important role.  Because skin is not sterile, your skin needs to be as free of germs as possible.  You can reduce the number of germs on your skin by washing with CHG (chlorahexidine gluconate) soap before surgery.  CHG is an antiseptic cleaner which kills germs and bonds with the skin to continue killing germs even after washing. Please DO NOT use if you have an allergy to CHG or antibacterial soaps.  If your skin becomes reddened/irritated stop using the CHG and inform your nurse when you arrive at Short Stay. Do not shave (including legs and underarms) for at least 48 hours prior to the first CHG shower.  You may shave your face/neck.  Please follow these instructions carefully:  1.  Shower with CHG Soap the night before surgery and the  morning of surgery.  2.  If you choose to wash your hair, wash your hair first as usual with your normal  shampoo.  3.  After you shampoo, rinse your hair and body thoroughly to remove the shampoo.                             4.  Use CHG as you would any other liquid soap.  You can apply  chg directly to the skin and wash.  Gently with a scrungie or clean washcloth.  5.  Apply the CHG Soap to your body ONLY FROM THE NECK DOWN.   Do not use on face/ open                           Wound or open sores. Avoid contact with eyes, ears mouth and genitals (private parts).                       Wash  face,  Genitals (private parts) with your normal soap.             6.  Wash thoroughly, paying special attention to the area where your  surgery  will be performed.  7.  Thoroughly rinse your body with warm water from the neck down.  8.  DO NOT shower/wash with your normal soap after using and rinsing off the CHG Soap.            9.  Pat yourself dry with a clean towel.            10.  Wear clean pajamas.            11.  Place clean sheets on your bed the night of your first shower and do not  sleep with pets.  ON THE DAY OF SURGERY : Do not apply any lotions/deodorants the morning of surgery.  Please wear clean clothes to the hospital/surgery center.   Preparing for Total Shoulder Arthroplasty ================================================================= Please follow these instructions carefully, in addition to any other special Bathing information that was explained to you at the Presurgical Appointment:  BENZOYL PEROXIDE 5% GEL: Used to kill bacteria on the skin which could cause an infection at the surgery site.   Please do not use if you have an allergy to benzoyl peroxide. If your skin becomes reddened/irritated stop using the benzoyl peroxide and inform your Doctor.   Starting two days before surgery, apply as follows:  1. Apply benzoyl peroxide gel in the morning and at night. Apply after taking a shower. If you are not taking a shower, clean entire shoulder front, back, and side, along with the armpit with a clean wet washcloth.  2. Place a quarter-sized dollop of the gel on your SHOULDER and rub in thoroughly, making sure to cover the front, back, and side of your shoulder, along with the armpit.   2 Days prior to Surgery      Wednesday  June 20, 2022 First Dose  _______ Morning Second Dose  _______ Night  Day Before Surgery             Thursday  June 21, 2022 First Dose  ______ Morning  On the night before surgery, wash your entire body (except hair, face and  private areas) with CHG Soap. THEN, rub in the LAST application of the Benzoyl Peroxide Gel on your shoulder.   3. On the Morning of Surgery wash your BODY AGAIN with CHG Soap (except hair, face and private areas)  4. DO NOT USE THE BENZOYL PEROXIDE GEL ON THE DAY OF YOUR SURGERY       FAILURE TO FOLLOW THESE INSTRUCTIONS MAY RESULT IN THE CANCELLATION OF YOUR SURGERY  PATIENT SIGNATURE_________________________________  NURSE SIGNATURE__________________________________  ________________________________________________________________________           Adam Phenix    An incentive spirometer is a tool that can  help keep your lungs clear and active. This tool measures how well you are filling your lungs with each breath. Taking long deep breaths may help reverse or decrease the chance of developing breathing (pulmonary) problems (especially infection) following: A long period of time when you are unable to move or be active. BEFORE THE PROCEDURE  If the spirometer includes an indicator to show your best effort, your nurse or respiratory therapist will set it to a desired goal. If possible, sit up straight or lean slightly forward. Try not to slouch. Hold the incentive spirometer in an upright position. INSTRUCTIONS FOR USE  Sit on the edge of your bed if possible, or sit up as far as you can in bed or on a chair. Hold the incentive spirometer in an upright position. Breathe out normally. Place the mouthpiece in your mouth and seal your lips tightly around it. Breathe in slowly and as deeply as possible, raising the piston or the ball toward the top of the column. Hold your breath for 3-5 seconds or for as long as possible. Allow the piston or ball to fall to the bottom of the column. Remove the mouthpiece from your mouth and breathe out normally. Rest for a few seconds and repeat Steps 1 through 7 at least 10 times every 1-2 hours when you are awake. Take your time and  take a few normal breaths between deep breaths. The spirometer may include an indicator to show your best effort. Use the indicator as a goal to work toward during each repetition. After each set of 10 deep breaths, practice coughing to be sure your lungs are clear. If you have an incision (the cut made at the time of surgery), support your incision when coughing by placing a pillow or rolled up towels firmly against it. Once you are able to get out of bed, walk around indoors and cough well. You may stop using the incentive spirometer when instructed by your caregiver.  RISKS AND COMPLICATIONS Take your time so you do not get dizzy or light-headed. If you are in pain, you may need to take or ask for pain medication before doing incentive spirometry. It is harder to take a deep breath if you are having pain. AFTER USE Rest and breathe slowly and easily. It can be helpful to keep track of a log of your progress. Your caregiver can provide you with a simple table to help with this. If you are using the spirometer at home, follow these instructions: [place] IF:  You are having difficultly using the spirometer. You have trouble using the spirometer as often as instructed. Your pain medication is not giving enough relief while using the spirometer. You develop fever of 100.5 F (38.1 C) or higher.                                                                                                    SEEK IMMEDIATE MEDICAL CARE IF:  You cough up bloody sputum that had not been present before. You develop fever of 102 F (38.9 C) or greater. You  develop worsening pain at or near the incision site. MAKE SURE YOU:  Understand these instructions. Will watch your condition. Will get help right away if you are not doing well or get worse. Document Released: 12/10/2006 Document Revised: 10/22/2011 Document Reviewed: 02/10/2007 [facility] Patient Information 2014 [place], [place].

## 2022-06-13 ENCOUNTER — Encounter (HOSPITAL_COMMUNITY)
Admission: RE | Admit: 2022-06-13 | Discharge: 2022-06-13 | Disposition: A | Discharge status: Home / Self Care | Payer: Medicare HMO | Source: Ambulatory Visit | Attending: Orthopedic Surgery | Admitting: Orthopedic Surgery

## 2022-06-13 ENCOUNTER — Other Ambulatory Visit: Payer: Self-pay

## 2022-06-13 ENCOUNTER — Encounter (HOSPITAL_COMMUNITY): Payer: Self-pay

## 2022-06-13 VITALS — BP 134/62 | HR 62 | Temp 98.1°F | Resp 16 | Ht 60.0 in | Wt 141.0 lb

## 2022-06-13 DIAGNOSIS — I10 Essential (primary) hypertension: Secondary | ICD-10-CM | POA: Insufficient documentation

## 2022-06-13 DIAGNOSIS — Z01818 Encounter for other preprocedural examination: Secondary | ICD-10-CM | POA: Diagnosis not present

## 2022-06-13 LAB — BASIC METABOLIC PANEL
Anion gap: 6 (ref 5–15)
BUN: 19 mg/dL (ref 8–23)
CO2: 26 mmol/L (ref 22–32)
Calcium: 10.4 mg/dL — ABNORMAL HIGH (ref 8.9–10.3)
Chloride: 109 mmol/L (ref 98–111)
Creatinine, Ser: 0.92 mg/dL (ref 0.44–1.00)
GFR, Estimated: >60 mL/min (ref >60)
Glucose, Bld: 118 mg/dL — ABNORMAL HIGH (ref 70–99)
Potassium: 3.8 mmol/L (ref 3.5–5.1)
Sodium: 141 mmol/L (ref 135–145)

## 2022-06-13 LAB — SURGICAL PCR SCREEN
MRSA, PCR: NEGATIVE
Staphylococcus aureus: NEGATIVE

## 2022-06-13 LAB — CBC
HCT: 39 % (ref 36.0–46.0)
Hemoglobin: 12.4 g/dL (ref 12.0–15.0)
MCH: 29.6 pg (ref 26.0–34.0)
MCHC: 31.8 g/dL (ref 30.0–36.0)
MCV: 93.1 fL (ref 80.0–100.0)
Platelets: 192 10*3/uL (ref 150–400)
RBC: 4.19 MIL/uL (ref 3.87–5.11)
RDW: 13.6 % (ref 11.5–15.5)
WBC: 6.8 10*3/uL (ref 4.0–10.5)
nRBC: 0 % (ref 0.0–0.2)

## 2022-06-22 ENCOUNTER — Ambulatory Visit (HOSPITAL_BASED_OUTPATIENT_CLINIC_OR_DEPARTMENT_OTHER): Payer: Medicare HMO | Admitting: Certified Registered Nurse Anesthetist

## 2022-06-22 ENCOUNTER — Observation Stay (HOSPITAL_COMMUNITY): Payer: Medicare HMO

## 2022-06-22 ENCOUNTER — Encounter (HOSPITAL_COMMUNITY)
Admission: RE | Disposition: A | Discharge status: Home / Self Care | Payer: Self-pay | Source: Home / Self Care | Attending: Orthopedic Surgery

## 2022-06-22 ENCOUNTER — Encounter (HOSPITAL_COMMUNITY): Payer: Self-pay | Admitting: Orthopedic Surgery

## 2022-06-22 ENCOUNTER — Observation Stay (HOSPITAL_COMMUNITY)
Admission: RE | Admit: 2022-06-22 | Discharge: 2022-06-26 | Disposition: A | Discharge status: Home / Self Care | Payer: Medicare HMO | Attending: Orthopedic Surgery | Admitting: Orthopedic Surgery

## 2022-06-22 ENCOUNTER — Other Ambulatory Visit: Payer: Self-pay

## 2022-06-22 ENCOUNTER — Ambulatory Visit (HOSPITAL_COMMUNITY): Payer: Medicare HMO | Admitting: Physician Assistant

## 2022-06-22 DIAGNOSIS — G8918 Other acute postprocedural pain: Secondary | ICD-10-CM | POA: Diagnosis not present

## 2022-06-22 DIAGNOSIS — Z8673 Personal history of transient ischemic attack (TIA), and cerebral infarction without residual deficits: Secondary | ICD-10-CM | POA: Diagnosis not present

## 2022-06-22 DIAGNOSIS — I480 Paroxysmal atrial fibrillation: Secondary | ICD-10-CM | POA: Diagnosis not present

## 2022-06-22 DIAGNOSIS — M75101 Unspecified rotator cuff tear or rupture of right shoulder, not specified as traumatic: Principal | ICD-10-CM | POA: Insufficient documentation

## 2022-06-22 DIAGNOSIS — Z96653 Presence of artificial knee joint, bilateral: Secondary | ICD-10-CM | POA: Diagnosis not present

## 2022-06-22 DIAGNOSIS — M12811 Other specific arthropathies, not elsewhere classified, right shoulder: Secondary | ICD-10-CM | POA: Diagnosis not present

## 2022-06-22 DIAGNOSIS — Z79899 Other long term (current) drug therapy: Secondary | ICD-10-CM | POA: Diagnosis not present

## 2022-06-22 DIAGNOSIS — Z471 Aftercare following joint replacement surgery: Secondary | ICD-10-CM | POA: Diagnosis not present

## 2022-06-22 DIAGNOSIS — Z96612 Presence of left artificial shoulder joint: Secondary | ICD-10-CM | POA: Insufficient documentation

## 2022-06-22 DIAGNOSIS — Z7982 Long term (current) use of aspirin: Secondary | ICD-10-CM | POA: Diagnosis not present

## 2022-06-22 DIAGNOSIS — I10 Essential (primary) hypertension: Secondary | ICD-10-CM | POA: Diagnosis not present

## 2022-06-22 DIAGNOSIS — I4891 Unspecified atrial fibrillation: Secondary | ICD-10-CM | POA: Diagnosis not present

## 2022-06-22 DIAGNOSIS — Z85828 Personal history of other malignant neoplasm of skin: Secondary | ICD-10-CM | POA: Diagnosis not present

## 2022-06-22 DIAGNOSIS — Z96611 Presence of right artificial shoulder joint: Secondary | ICD-10-CM | POA: Diagnosis not present

## 2022-06-22 DIAGNOSIS — M19011 Primary osteoarthritis, right shoulder: Secondary | ICD-10-CM | POA: Diagnosis not present

## 2022-06-22 HISTORY — PX: REVERSE SHOULDER ARTHROPLASTY: SHX5054

## 2022-06-22 SURGERY — ARTHROPLASTY, SHOULDER, TOTAL, REVERSE
Anesthesia: General | Site: Shoulder | Laterality: Right

## 2022-06-22 MED ORDER — CHLORHEXIDINE GLUCONATE 0.12 % MT SOLN
15.0000 mL | Freq: Once | OROMUCOSAL | Status: AC
  Administered 2022-06-22: 15 mL via OROMUCOSAL

## 2022-06-22 MED ORDER — DEXAMETHASONE SODIUM PHOSPHATE 10 MG/ML IJ SOLN
INTRAMUSCULAR | Status: DC | PRN
  Administered 2022-06-22: 5 mg via INTRAVENOUS

## 2022-06-22 MED ORDER — TRANEXAMIC ACID-NACL 1000-0.7 MG/100ML-% IV SOLN
1000.0000 mg | Freq: Once | INTRAVENOUS | Status: AC
  Administered 2022-06-22: 1000 mg via INTRAVENOUS
  Filled 2022-06-22: qty 100

## 2022-06-22 MED ORDER — POLYVINYL ALCOHOL 1.4 % OP SOLN: 1.0000 [drp] | OPHTHALMIC | Status: DC | PRN

## 2022-06-22 MED ORDER — MIDAZOLAM HCL 2 MG/2ML IJ SOLN
2.0000 mg | INTRAMUSCULAR | Status: DC
  Filled 2022-06-22: qty 2

## 2022-06-22 MED ORDER — SODIUM CHLORIDE 0.9 % IR SOLN
Status: DC | PRN
  Administered 2022-06-22: 1000 mL

## 2022-06-22 MED ORDER — MORPHINE SULFATE (PF) 2 MG/ML IV SOLN: 0.5000 mg | INTRAVENOUS | Status: DC | PRN

## 2022-06-22 MED ORDER — SODIUM CHLORIDE 0.9 % IV SOLN: INTRAVENOUS | Status: DC

## 2022-06-22 MED ORDER — AMLODIPINE BESYLATE 5 MG PO TABS
5.0000 mg | ORAL_TABLET | Freq: Every day | ORAL | Status: DC
  Administered 2022-06-23 – 2022-06-26 (×4): 5 mg via ORAL
  Filled 2022-06-22 (×4): qty 1

## 2022-06-22 MED ORDER — ATORVASTATIN CALCIUM 40 MG PO TABS
40.0000 mg | ORAL_TABLET | Freq: Every day | ORAL | Status: DC
  Administered 2022-06-22 – 2022-06-25 (×4): 40 mg via ORAL
  Filled 2022-06-22 (×4): qty 1

## 2022-06-22 MED ORDER — VITAMIN D 25 MCG (1000 UNIT) PO TABS
1000.0000 [IU] | ORAL_TABLET | Freq: Every day | ORAL | Status: DC
  Administered 2022-06-23 – 2022-06-26 (×4): 1000 [IU] via ORAL
  Filled 2022-06-22 (×4): qty 1

## 2022-06-22 MED ORDER — ROCURONIUM BROMIDE 10 MG/ML (PF) SYRINGE
PREFILLED_SYRINGE | INTRAVENOUS | Status: DC | PRN
  Administered 2022-06-22: 50 mg via INTRAVENOUS

## 2022-06-22 MED ORDER — FENTANYL CITRATE PF 50 MCG/ML IJ SOSY: 25.0000 ug | PREFILLED_SYRINGE | INTRAMUSCULAR | Status: DC | PRN

## 2022-06-22 MED ORDER — ORAL CARE MOUTH RINSE: 15.0000 mL | Freq: Once | OROMUCOSAL | Status: AC

## 2022-06-22 MED ORDER — B COMPLEX VITAMINS PO CAPS: 1.0000 | ORAL_CAPSULE | Freq: Every day | ORAL | Status: DC

## 2022-06-22 MED ORDER — ASPIRIN 325 MG PO TBEC
325.0000 mg | DELAYED_RELEASE_TABLET | Freq: Every day | ORAL | Status: DC
  Administered 2022-06-23 – 2022-06-26 (×4): 325 mg via ORAL
  Filled 2022-06-22 (×4): qty 1

## 2022-06-22 MED ORDER — TRANEXAMIC ACID-NACL 1000-0.7 MG/100ML-% IV SOLN
INTRAVENOUS | Status: DC | PRN
  Administered 2022-06-22: 1000 mg via INTRAVENOUS

## 2022-06-22 MED ORDER — BUPIVACAINE LIPOSOME 1.3 % IJ SUSP: INTRAMUSCULAR | Status: DC | PRN

## 2022-06-22 MED ORDER — LOSARTAN POTASSIUM 50 MG PO TABS
100.0000 mg | ORAL_TABLET | Freq: Every day | ORAL | Status: DC
  Administered 2022-06-23 – 2022-06-26 (×4): 100 mg via ORAL
  Filled 2022-06-22 (×4): qty 2

## 2022-06-22 MED ORDER — DIPHENHYDRAMINE-APAP (SLEEP) 25-500 MG PO TABS: 1.0000 | ORAL_TABLET | Freq: Every day | ORAL | Status: DC

## 2022-06-22 MED ORDER — CARBOXYMETHYLCELLULOSE SODIUM 0.5 % OP SOLN: 1.0000 [drp] | Freq: Three times a day (TID) | OPHTHALMIC | Status: DC | PRN

## 2022-06-22 MED ORDER — BUPIVACAINE-EPINEPHRINE (PF) 0.5% -1:200000 IJ SOLN
INTRAMUSCULAR | Status: AC
  Filled 2022-06-22: qty 30

## 2022-06-22 MED ORDER — DOCUSATE SODIUM 100 MG PO CAPS
100.0000 mg | ORAL_CAPSULE | Freq: Two times a day (BID) | ORAL | Status: DC
  Administered 2022-06-22 – 2022-06-26 (×8): 100 mg via ORAL
  Filled 2022-06-22 (×9): qty 1

## 2022-06-22 MED ORDER — METOCLOPRAMIDE HCL 5 MG/ML IJ SOLN: 5.0000 mg | Freq: Three times a day (TID) | INTRAMUSCULAR | Status: DC | PRN

## 2022-06-22 MED ORDER — CAMPHOR-MENTHOL-METHYL SAL 3.1-6-10 % EX PTCH: 1.0000 | MEDICATED_PATCH | Freq: Two times a day (BID) | CUTANEOUS | Status: DC

## 2022-06-22 MED ORDER — PROPOFOL 10 MG/ML IV BOLUS
INTRAVENOUS | Status: DC | PRN
  Administered 2022-06-22: 70 mg via INTRAVENOUS

## 2022-06-22 MED ORDER — ONDANSETRON HCL 4 MG/2ML IJ SOLN
INTRAMUSCULAR | Status: DC | PRN
  Administered 2022-06-22: 4 mg via INTRAVENOUS

## 2022-06-22 MED ORDER — TRANEXAMIC ACID-NACL 1000-0.7 MG/100ML-% IV SOLN
INTRAVENOUS | Status: AC
  Filled 2022-06-22: qty 100

## 2022-06-22 MED ORDER — CEFAZOLIN SODIUM-DEXTROSE 2-4 GM/100ML-% IV SOLN: 2.0000 g | INTRAVENOUS | Status: DC

## 2022-06-22 MED ORDER — EPHEDRINE SULFATE-NACL 50-0.9 MG/10ML-% IV SOSY
PREFILLED_SYRINGE | INTRAVENOUS | Status: DC | PRN
  Administered 2022-06-22 (×3): 5 mg via INTRAVENOUS

## 2022-06-22 MED ORDER — ADULT MULTIVITAMIN W/MINERALS CH
1.0000 | ORAL_TABLET | Freq: Every day | ORAL | Status: DC
  Administered 2022-06-23 – 2022-06-26 (×4): 1 via ORAL
  Filled 2022-06-22 (×4): qty 1

## 2022-06-22 MED ORDER — B COMPLEX-C PO TABS
1.0000 | ORAL_TABLET | Freq: Every day | ORAL | Status: DC
  Administered 2022-06-23 – 2022-06-26 (×4): 1 via ORAL
  Filled 2022-06-22 (×4): qty 1

## 2022-06-22 MED ORDER — LIDOCAINE 2% (20 MG/ML) 5 ML SYRINGE
INTRAMUSCULAR | Status: DC | PRN
  Administered 2022-06-22: 40 mg via INTRAVENOUS

## 2022-06-22 MED ORDER — SUGAMMADEX SODIUM 200 MG/2ML IV SOLN
INTRAVENOUS | Status: DC | PRN
  Administered 2022-06-22: 200 mg via INTRAVENOUS

## 2022-06-22 MED ORDER — PHENOL 1.4 % MT LIQD: 1.0000 | OROMUCOSAL | Status: DC | PRN

## 2022-06-22 MED ORDER — ONDANSETRON HCL 4 MG/2ML IJ SOLN: 4.0000 mg | Freq: Four times a day (QID) | INTRAMUSCULAR | Status: DC | PRN

## 2022-06-22 MED ORDER — ONDANSETRON HCL 4 MG PO TABS: 4.0000 mg | ORAL_TABLET | Freq: Four times a day (QID) | ORAL | Status: DC | PRN

## 2022-06-22 MED ORDER — MENTHOL 3 MG MT LOZG
1.0000 | LOZENGE | OROMUCOSAL | Status: DC | PRN
  Administered 2022-06-23 – 2022-06-24 (×2): 3 mg via ORAL
  Filled 2022-06-22 (×2): qty 9

## 2022-06-22 MED ORDER — BUPIVACAINE HCL (PF) 0.5 % IJ SOLN
INTRAMUSCULAR | Status: DC | PRN
  Administered 2022-06-22: 10 mL via PERINEURAL

## 2022-06-22 MED ORDER — DULOXETINE HCL 60 MG PO CPEP
60.0000 mg | ORAL_CAPSULE | Freq: Every day | ORAL | Status: DC
  Administered 2022-06-23 – 2022-06-26 (×4): 60 mg via ORAL
  Filled 2022-06-22 (×4): qty 1

## 2022-06-22 MED ORDER — STERILE WATER FOR IRRIGATION IR SOLN
Status: DC | PRN
  Administered 2022-06-22: 2000 mL

## 2022-06-22 MED ORDER — CEFAZOLIN SODIUM-DEXTROSE 2-4 GM/100ML-% IV SOLN
2.0000 g | INTRAVENOUS | Status: AC
  Administered 2022-06-22: 2 g via INTRAVENOUS
  Filled 2022-06-22: qty 100

## 2022-06-22 MED ORDER — CEFAZOLIN SODIUM-DEXTROSE 2-4 GM/100ML-% IV SOLN
2.0000 g | Freq: Four times a day (QID) | INTRAVENOUS | Status: AC
  Administered 2022-06-22 – 2022-06-23 (×3): 2 g via INTRAVENOUS
  Filled 2022-06-22 (×3): qty 100

## 2022-06-22 MED ORDER — HYDROCODONE-ACETAMINOPHEN 5-325 MG PO TABS
1.0000 | ORAL_TABLET | ORAL | Status: DC | PRN
  Administered 2022-06-22 – 2022-06-25 (×5): 1 via ORAL
  Filled 2022-06-22 (×5): qty 1

## 2022-06-22 MED ORDER — PHENYLEPHRINE 80 MCG/ML (10ML) SYRINGE FOR IV PUSH (FOR BLOOD PRESSURE SUPPORT)
PREFILLED_SYRINGE | INTRAVENOUS | Status: DC | PRN
  Administered 2022-06-22 (×5): 80 ug via INTRAVENOUS

## 2022-06-22 MED ORDER — BUPIVACAINE-EPINEPHRINE 0.5% -1:200000 IJ SOLN
INTRAMUSCULAR | Status: DC | PRN
  Administered 2022-06-22: 30 mL

## 2022-06-22 MED ORDER — METOCLOPRAMIDE HCL 5 MG PO TABS: 5.0000 mg | ORAL_TABLET | Freq: Three times a day (TID) | ORAL | Status: DC | PRN

## 2022-06-22 MED ORDER — TROLAMINE SALICYLATE 10 % EX CREA: 1.0000 | TOPICAL_CREAM | CUTANEOUS | Status: DC | PRN

## 2022-06-22 MED ORDER — HYDRALAZINE HCL 25 MG PO TABS
25.0000 mg | ORAL_TABLET | Freq: Three times a day (TID) | ORAL | Status: DC
  Administered 2022-06-22 – 2022-06-26 (×10): 25 mg via ORAL
  Filled 2022-06-22 (×11): qty 1

## 2022-06-22 MED ORDER — LACTATED RINGERS IV SOLN: INTRAVENOUS | Status: DC

## 2022-06-22 MED ORDER — PROPOFOL 10 MG/ML IV BOLUS
INTRAVENOUS | Status: AC
  Filled 2022-06-22: qty 20

## 2022-06-22 MED ORDER — ACETAMINOPHEN 325 MG PO TABS
325.0000 mg | ORAL_TABLET | Freq: Four times a day (QID) | ORAL | Status: DC | PRN
  Administered 2022-06-24 – 2022-06-26 (×4): 650 mg via ORAL
  Filled 2022-06-22 (×5): qty 2

## 2022-06-22 MED ORDER — FENTANYL CITRATE PF 50 MCG/ML IJ SOSY
100.0000 ug | PREFILLED_SYRINGE | INTRAMUSCULAR | Status: DC
  Administered 2022-06-22: 25 ug via INTRAVENOUS
  Filled 2022-06-22: qty 2

## 2022-06-22 MED ORDER — PANTOPRAZOLE SODIUM 40 MG PO TBEC
40.0000 mg | DELAYED_RELEASE_TABLET | Freq: Every day | ORAL | Status: DC
  Administered 2022-06-23 – 2022-06-26 (×4): 40 mg via ORAL
  Filled 2022-06-22 (×4): qty 1

## 2022-06-22 MED ORDER — MUSCLE RUB 10-15 % EX CREA: TOPICAL_CREAM | CUTANEOUS | Status: DC | PRN

## 2022-06-22 MED ORDER — HYDROCODONE-ACETAMINOPHEN 5-325 MG PO TABS: 0.5000 | ORAL_TABLET | Freq: Four times a day (QID) | ORAL | 0 refills | Status: AC | PRN

## 2022-06-22 SURGICAL SUPPLY — 73 items
AID PSTN UNV HD RSTRNT DISP (MISCELLANEOUS) ×1
BAG COUNTER SPONGE SURGICOUNT (BAG) IMPLANT
BAG SPEC THK2 15X12 ZIP CLS (MISCELLANEOUS) ×1
BAG SPNG CNTER NS LX DISP (BAG) ×1
BAG ZIPLOCK 12X15 (MISCELLANEOUS) IMPLANT
BIT DRILL 1.6MX128 (BIT) IMPLANT
BIT DRILL 170X2.5X (BIT) IMPLANT
BIT DRL 170X2.5X (BIT) ×1
BLADE SAG 18X100X1.27 (BLADE) ×1 IMPLANT
COVER BACK TABLE 60X90IN (DRAPES) ×1 IMPLANT
COVER SURGICAL LIGHT HANDLE (MISCELLANEOUS) ×1 IMPLANT
DRAPE INCISE IOBAN 66X45 STRL (DRAPES) ×1 IMPLANT
DRAPE ORTHO SPLIT 77X108 STRL (DRAPES) ×2
DRAPE SHEET LG 3/4 BI-LAMINATE (DRAPES) ×1 IMPLANT
DRAPE SURG ORHT 6 SPLT 77X108 (DRAPES) ×2 IMPLANT
DRAPE TOP 10253 STERILE (DRAPES) ×1 IMPLANT
DRAPE U-SHAPE 47X51 STRL (DRAPES) ×1 IMPLANT
DRILL 2.5 (BIT) ×1
DRSG ADAPTIC 3X8 NADH LF (GAUZE/BANDAGES/DRESSINGS) ×1 IMPLANT
DURAPREP 26ML APPLICATOR (WOUND CARE) ×1 IMPLANT
ECCENTRIC EPIPHYSI MODULAR SZ1 (Trauma) IMPLANT
ELECT BLADE TIP CTD 4 INCH (ELECTRODE) ×1 IMPLANT
ELECT NDL TIP 2.8 STRL (NEEDLE) ×1 IMPLANT
ELECT NEEDLE TIP 2.8 STRL (NEEDLE) ×1 IMPLANT
ELECT REM PT RETURN 15FT ADLT (MISCELLANEOUS) ×1 IMPLANT
FACESHIELD WRAPAROUND (MASK) ×2 IMPLANT
FACESHIELD WRAPAROUND OR TEAM (MASK) ×1 IMPLANT
GAUZE PAD ABD 8X10 STRL (GAUZE/BANDAGES/DRESSINGS) ×1 IMPLANT
GAUZE SPONGE 4X4 12PLY STRL (GAUZE/BANDAGES/DRESSINGS) ×1 IMPLANT
GLENOSPHERE DELTA XTEND LAT 38 (Miscellaneous) IMPLANT
GLOVE BIOGEL PI IND STRL 7.5 (GLOVE) ×1 IMPLANT
GLOVE BIOGEL PI IND STRL 8.5 (GLOVE) ×1 IMPLANT
GLOVE ORTHO TXT STRL SZ7.5 (GLOVE) ×1 IMPLANT
GLOVE SURG ORTHO 8.5 STRL (GLOVE) ×1 IMPLANT
GOWN STRL REUS W/ TWL XL LVL3 (GOWN DISPOSABLE) ×2 IMPLANT
GOWN STRL REUS W/TWL XL LVL3 (GOWN DISPOSABLE) ×3
KIT BASIN OR (CUSTOM PROCEDURE TRAY) ×1 IMPLANT
KIT TURNOVER KIT A (KITS) IMPLANT
MANIFOLD NEPTUNE II (INSTRUMENTS) ×1 IMPLANT
METAGLENE DELTA EXTEND (Trauma) IMPLANT
METAGLENE DXTEND (Trauma) ×1 IMPLANT
MODULAR ECCENTRIC EPIPHYSI SZ1 (Trauma) ×1 IMPLANT
NDL MAYO CATGUT SZ4 TPR NDL (NEEDLE) ×1 IMPLANT
NEEDLE MAYO CATGUT SZ4 (NEEDLE) ×1 IMPLANT
NS IRRIG 1000ML POUR BTL (IV SOLUTION) ×1 IMPLANT
PACK SHOULDER (CUSTOM PROCEDURE TRAY) ×1 IMPLANT
PIN GUIDE 1.2 (PIN) IMPLANT
PIN GUIDE GLENOPHERE 1.5MX300M (PIN) IMPLANT
PIN METAGLENE 2.5 (PIN) IMPLANT
PROTECTOR NERVE ULNAR (MISCELLANEOUS) ×1 IMPLANT
RESTRAINT HEAD UNIVERSAL NS (MISCELLANEOUS) ×1 IMPLANT
SCREW LOCK 42 (Screw) IMPLANT
SCREW LOCK DELTA XTEND 4.5X30 (Screw) IMPLANT
SLING ARM FOAM STRAP LRG (SOFTGOODS) IMPLANT
SMARTMIX MINI TOWER (MISCELLANEOUS)
SPACER 38 PLUS 3 (Spacer) IMPLANT
SPIKE FLUID TRANSFER (MISCELLANEOUS) ×1 IMPLANT
SPONGE T-LAP 4X18 ~~LOC~~+RFID (SPONGE) ×1 IMPLANT
STEM HUMERAL SZ8 STANDARD (Stem) ×1 IMPLANT
STEM HUMERAL SZ8 STD (Stem) IMPLANT
STRIP CLOSURE SKIN 1/2X4 (GAUZE/BANDAGES/DRESSINGS) ×1 IMPLANT
SUCTION FRAZIER HANDLE 10FR (MISCELLANEOUS) ×1
SUCTION TUBE FRAZIER 10FR DISP (MISCELLANEOUS) ×1 IMPLANT
SUT FIBERWIRE #2 38 T-5 BLUE (SUTURE) ×2
SUT MNCRL AB 4-0 PS2 18 (SUTURE) ×1 IMPLANT
SUT VIC AB 0 CT1 36 (SUTURE) ×2 IMPLANT
SUT VIC AB 0 CT2 27 (SUTURE) ×1 IMPLANT
SUT VIC AB 2-0 CT1 27 (SUTURE) ×1
SUT VIC AB 2-0 CT1 TAPERPNT 27 (SUTURE) ×1 IMPLANT
SUTURE FIBERWR #2 38 T-5 BLUE (SUTURE) ×2 IMPLANT
TAPE PAPER 3X10 WHT MICROPORE (GAUZE/BANDAGES/DRESSINGS) IMPLANT
TOWEL OR 17X26 10 PK STRL BLUE (TOWEL DISPOSABLE) ×1 IMPLANT
TOWER SMARTMIX MINI (MISCELLANEOUS) IMPLANT

## 2022-06-22 NOTE — Anesthesia Procedure Notes (Signed)
Anesthesia Regional Block: Interscalene brachial plexus block   Pre-Anesthetic Checklist: , timeout performed,  Correct Patient, Correct Site, Correct Laterality,  Correct Procedure, Correct Position, site marked,  Risks and benefits discussed,  Surgical consent,  Pre-op evaluation,  At surgeon's request and post-op pain management  Laterality: Right  Prep: chloraprep       Needles:  Injection technique: Single-shot  Needle Type: Echogenic Stimulator Needle     Needle Length: 9cm  Needle Gauge: 21     Additional Needles:   Procedures:,,,, ultrasound used (permanent image in chart),,    Narrative:  Start time: 06/22/2022 10:35 AM End time: 06/22/2022 10:40 AM Injection made incrementally with aspirations every 5 mL.  Performed by: Personally  Anesthesiologist: Effie Berkshire, MD  Additional Notes: Discussed risks and benefits of the nerve block in detail, including but not limited vascular injury, permanent nerve damage and infection.   Patient tolerated the procedure well. Local anesthetic introduced in an incremental fashion under minimal resistance after negative aspirations. No paresthesias were elicited. After completion of the procedure, no acute issues were identified and patient continued to be monitored by RN.

## 2022-06-22 NOTE — Anesthesia Postprocedure Evaluation (Signed)
Anesthesia Post Note  Patient: [name]  Procedure(s) Performed: REVERSE total SHOULDER ARTHROPLASTY (Right: Shoulder)     Patient location during evaluation: PACU Anesthesia Type: General Level of consciousness: awake and alert Pain management: pain level controlled Vital Signs Assessment: post-procedure vital signs reviewed and stable Respiratory status: spontaneous breathing, nonlabored ventilation, respiratory function stable and patient connected to nasal cannula oxygen Cardiovascular status: blood pressure returned to baseline and stable Postop Assessment: no apparent nausea or vomiting Anesthetic complications: no   No notable events documented.  Last Vitals:  Vitals:   06/22/22 1445 06/22/22 1500  BP: (!) 136/56 132/64  Pulse: 86 87  Resp: 16 20  Temp:  36.7 C  SpO2: 91% 93%    Last Pain:  Vitals:   06/22/22 1500  TempSrc:   PainSc: 0-No pain                 Effie Berkshire

## 2022-06-22 NOTE — Interval H&P Note (Signed)
History and Physical Interval Note:  06/22/2022 11:33 AM  [name]  has presented today for surgery, with the diagnosis of ROTATOR CUFF TEAR LEFT.  The various methods of treatment have been discussed with the patient and family. After consideration of risks, benefits and other options for treatment, the patient has consented to  Procedure(s) with comments: REVERSE total SHOULDER ARTHROPLASTY (Right) - choice and interscalene block as a surgical intervention.  The patient's history has been reviewed, patient examined, no change in status, stable for surgery.  I have reviewed the patient's chart and labs.  Questions were answered to the patient's satisfaction.     [name]

## 2022-06-22 NOTE — Brief Op Note (Signed)
06/22/2022  1:54 PM  PATIENT:  Mickeal Needy  88 y.o. female  PRE-OPERATIVE DIAGNOSIS:  ROTATOR CUFF TEAR LEFT, end stage OA  POST-OPERATIVE DIAGNOSIS:  ROTATOR CUFF TEAR LEFT, end stage OA  PROCEDURE:  Procedure(s) with comments: REVERSE total SHOULDER ARTHROPLASTY (Right) - choice and interscalene block NO subscap repair SURGEON:  Surgeon(s) and Role:    Netta Cedars, MD - Primary  PHYSICIAN ASSISTANT:   ASSISTANTS: [place]ura Bruns, PA-C   ANESTHESIA:   regional and general  EBL:  100 mL   BLOOD ADMINISTERED:none  DRAINS: none   LOCAL MEDICATIONS USED:  MARCAINE     SPECIMEN:  No Specimen  DISPOSITION OF SPECIMEN:  N/A  COUNTS:  YES  TOURNIQUET:  * No tourniquets in log *  DICTATION: .Other Dictation: Dictation Number [id number]  PLAN OF CARE: Admit for overnight observation  PATIENT DISPOSITION:  PACU - hemodynamically stable.   Delay start of Pharmacological VTE agent (>24hrs) due to surgical blood loss or risk of bleeding: not applicable

## 2022-06-22 NOTE — Discharge Instructions (Signed)
Ice to the shoulder constantly.  Keep the incision covered and clean and dry for one week, then ok to get it wet in the shower.  Do gentle exercise as instructed several times per day.  DO NOT reach behind your back or push up out of a chair with the operative arm.  Use a sling while you are up and around for comfort, may remove while seated.  Keep pillow propped behind the operative elbow so that your arm is across your waist  Follow up with Dr Veverly Fells in two weeks in the office, call [phone] for appt

## 2022-06-22 NOTE — Transfer of Care (Signed)
Immediate Anesthesia Transfer of Care Note  Patient: [name]  Procedure(s) Performed: Procedure(s) with comments: REVERSE total SHOULDER ARTHROPLASTY (Right) - choice and interscalene block  Patient Location: PACU  Anesthesia Type:General  Level of Consciousness: Alert, Awake, Oriented  Airway & Oxygen Therapy: Patient Spontanous Breathing  Post-op Assessment: Report given to RN  Post vital signs: Reviewed and stable  Last Vitals:  Vitals:   06/22/22 1055 06/22/22 1100  BP: 129/62 132/62  Pulse: 92 90  Resp: 20 14  Temp:    SpO2: 38% 37%    Complications: No apparent anesthesia complications

## 2022-06-22 NOTE — Anesthesia Preprocedure Evaluation (Addendum)
Anesthesia Evaluation  Patient identified by MRN, date of birth, ID band Patient awake    Reviewed: Allergy & Precautions, NPO status , Patient's Chart, lab work & pertinent test results  Airway Mallampati: I  TM Distance: >3 FB Neck ROM: Full    Dental  (+) Upper Dentures, Lower Dentures   Pulmonary    breath sounds clear to auscultation       Cardiovascular hypertension, Pt. on medications + dysrhythmias Atrial Fibrillation  Rhythm:Regular Rate:Normal     Neuro/Psych CVA  negative psych ROS   GI/Hepatic Neg liver ROS,GERD  Medicated,,  Endo/Other  negative endocrine ROS    Renal/GU negative Renal ROS     Musculoskeletal  (+) Arthritis ,    Abdominal Normal abdominal exam  (+)   Peds  Hematology negative hematology ROS (+)   Anesthesia Other Findings   Reproductive/Obstetrics                              Anesthesia Physical Anesthesia Plan  ASA: 2  Anesthesia Plan: General   Post-op Pain Management:    Induction: Intravenous  PONV Risk Score and Plan: 4 or greater  Airway Management Planned:   Additional Equipment: None  Intra-op Plan:   Post-operative Plan: Extubation in OR  Informed Consent: I have reviewed the patients History and Physical, chart, labs and discussed the procedure including the risks, benefits and alternatives for the proposed anesthesia with the patient or authorized representative who has indicated his/her understanding and acceptance.       Plan Discussed with: CRNA  Anesthesia Plan Comments:         Anesthesia Quick Evaluation

## 2022-06-22 NOTE — Op Note (Unsigned)
NAME: [name], [name] MEDICAL RECORD NO: [id number] ACCOUNT NO: [id number] DATE OF BIRTH: February 22, 1933 FACILITY: [facility]ATION: [id number] PHYSICIAN: Doran Heater. Veverly Fells, MD  Operative Report   DATE OF PROCEDURE: 06/22/2022  PREOPERATIVE DIAGNOSES:  Right shoulder rotator cuff tear arthropathy.  POSTOPERATIVE DIAGNOSES:  Right shoulder rotator cuff tear arthropathy.  PROCEDURE PERFORMED:  Right reverse shoulder replacement using DePuy Delta Xtend prosthesis with no subscap repair.  ATTENDING SURGEON:  Doran Heater. Veverly Fells, MD  ASSISTANT:  Charletta Cousin [place], [place], who was scrubbed during the entire procedure, and necessary for satisfactory completion of surgery.  ANESTHESIA:  General anesthesia was used plus interscalene block.  ESTIMATED BLOOD LOSS:  100 mL  FLUID REPLACEMENT:  1500 mL crystalloid.  COUNTS:  Instrument counts correct.  COMPLICATIONS:  No complications.  ANTIBIOTICS:  Perioperative antibiotics were given.  INDICATIONS:  The patient is a 88 year old female who presents with a history of worsening shoulder pain and dysfunction secondary to end-stage rotator cuff tear arthropathy.  The patient has had worsening pain and declining function despite conservative  care and presents for reverse shoulder replacement in order to improve function and to eliminate pain.  Informed consent obtained.  DESCRIPTION OF PROCEDURE:  After adequate level of anesthesia was achieved, the patient was positioned in modified beach chair position.  Right shoulder correctly identified and sterilely prepped and draped in the usual manner.  Timeout called, verifying  correct patient, correct site, we entered the patient's shoulder using a standard deltopectoral approach, starting at the coracoid process extending down the anterior humerus.  Dissection down through subcutaneous tissues using Bovie.  Cephalic vein was  identified and taken laterally with the deltoid, pectoralis taken medially.  Conjoined  tendon identified and retracted medially.  Scar tissue and bursal tissue removed.  We tenodesed the biceps in situ with 0 Vicryl figure-of-eight suture x2.  We then  released the subscapularis and tagged for protection of the axillary nerve.  Next, we released the inferior capsule progressively externally rotating the humerus.  We delivered the humeral head out of the wound, which was devoid of any significant  rotator cuff attachment except for a little bit posteriorly.  We went ahead and entered the proximal humerus with a 6 mm reamer, reaming up to a size 8.  We then placed her 8 mm T-handle guide and resected the head at 20 degrees of retroversion with the  oscillating saw.  Next, we removed excess osteophytes.  We then subluxed the humerus posteriorly, gaining good exposure of the glenoid face.  We removed the capsule and labrum and also the remnant of the rotator cuff.  The patient had no cartilage on the  glenoid face.  We went ahead and found the center point with the guide pin and drilled the guide pin for the metaglene preparation.  We then reamed for the metaglene baseplate down to subchondral bone.  We then drilled out our central peg hole, did our  peripheral hand reaming and then impacted the HA coated press-fit baseplate into position.  We placed a 42 screw inferiorly, a 30 screw at the base of the coracoid and locked both those screws.  We had good baseplate security and support.  We next  selected a 38 standard glenosphere and attached that to baseplate in the usual fashion.  We next did a finger sweep to make sure there was no soft tissue caught up between the baseplate and the glenosphere.  Next, we went to the humeral side, we reamed  for the  one right metaphysis and then trialled with the size 8 stem and one right set on the 0 setting and impacted in 20 degrees of retroversion.  We trialed with a 38+3 poly we are happy with our soft tissue balancing and stability.  We removed the  trial  components.  We irrigated thoroughly, used the [place] size 8 stem with the 1 right HA coated metaphysis set on the 0 setting.  We used available bone graft from the humeral head and after irrigating the humerus we impaction grafted using  impaction grafting technique to impact the stem into place.  We had a nice stable stem.  We selected the real 38+3 poly impacted on the humeral tray and reduced the shoulder.  We had nice little pop as it reduced.  Appropriate stability and good  tensioning.  We irrigated thoroughly, removed the subscap remnant and then went ahead and repaired the deltopectoral interval with 0 Vicryl suture followed by 2-0 Vicryl for subcutaneous closure and 4-0 Monocryl for skin.  Steri-Strips applied followed  by sterile dressing.  The patient tolerated surgery well.   PUS D: 06/22/2022 1:59:45 pm T: 06/22/2022 6:43:00 pm  JOB: [id number]/ [id number]

## 2022-06-22 NOTE — Plan of Care (Signed)
  Problem: Education: Goal: Knowledge of the prescribed therapeutic regimen will improve Outcome: Progressing   Problem: Activity: Goal: Ability to tolerate increased activity will improve Outcome: Progressing   Problem: Pain Management: Goal: Pain level will decrease with appropriate interventions Outcome: Progressing

## 2022-06-22 NOTE — Anesthesia Procedure Notes (Signed)
Procedure Name: Intubation Date/Time: 06/22/2022 12:10 PM  Performed by: Gerald Leitz, CRNAPre-anesthesia Checklist: Patient identified, Patient being monitored, Timeout performed, Emergency Drugs available and Suction available Patient Re-evaluated:Patient Re-evaluated prior to induction Oxygen Delivery Method: Circle system utilized Preoxygenation: Pre-oxygenation with 100% oxygen Induction Type: IV induction Ventilation: Mask ventilation without difficulty Laryngoscope Size: Mac and 3 Grade View: Grade I Tube type: Oral Tube size: 7.0 mm Number of attempts: 1 Airway Equipment and Method: Stylet Placement Confirmation: ETT inserted through vocal cords under direct vision, positive ETCO2 and breath sounds checked- equal and bilateral Secured at: 21 cm Tube secured with: Tape Dental Injury: Teeth and Oropharynx as per pre-operative assessment

## 2022-06-23 DIAGNOSIS — Z79899 Other long term (current) drug therapy: Secondary | ICD-10-CM | POA: Diagnosis not present

## 2022-06-23 DIAGNOSIS — Z85828 Personal history of other malignant neoplasm of skin: Secondary | ICD-10-CM | POA: Diagnosis not present

## 2022-06-23 DIAGNOSIS — Z96612 Presence of left artificial shoulder joint: Secondary | ICD-10-CM | POA: Diagnosis not present

## 2022-06-23 DIAGNOSIS — I10 Essential (primary) hypertension: Secondary | ICD-10-CM | POA: Diagnosis not present

## 2022-06-23 DIAGNOSIS — M75101 Unspecified rotator cuff tear or rupture of right shoulder, not specified as traumatic: Secondary | ICD-10-CM | POA: Diagnosis not present

## 2022-06-23 DIAGNOSIS — Z96653 Presence of artificial knee joint, bilateral: Secondary | ICD-10-CM | POA: Diagnosis not present

## 2022-06-23 DIAGNOSIS — Z7982 Long term (current) use of aspirin: Secondary | ICD-10-CM | POA: Diagnosis not present

## 2022-06-23 DIAGNOSIS — I480 Paroxysmal atrial fibrillation: Secondary | ICD-10-CM | POA: Diagnosis not present

## 2022-06-23 DIAGNOSIS — Z8673 Personal history of transient ischemic attack (TIA), and cerebral infarction without residual deficits: Secondary | ICD-10-CM | POA: Diagnosis not present

## 2022-06-23 LAB — BASIC METABOLIC PANEL
Anion gap: 4 — ABNORMAL LOW (ref 5–15)
BUN: 14 mg/dL (ref 8–23)
CO2: 27 mmol/L (ref 22–32)
Calcium: 9.6 mg/dL (ref 8.9–10.3)
Chloride: 108 mmol/L (ref 98–111)
Creatinine, Ser: 0.8 mg/dL (ref 0.44–1.00)
GFR, Estimated: >60 mL/min (ref >60)
Glucose, Bld: 165 mg/dL — ABNORMAL HIGH (ref 70–99)
Potassium: 3.7 mmol/L (ref 3.5–5.1)
Sodium: 139 mmol/L (ref 135–145)

## 2022-06-23 LAB — GLUCOSE, CAPILLARY: Glucose-Capillary: 121 mg/dL — ABNORMAL HIGH (ref 70–99)

## 2022-06-23 LAB — HEMOGLOBIN AND HEMATOCRIT, BLOOD
HCT: 34.2 % — ABNORMAL LOW (ref 36.0–46.0)
Hemoglobin: 10.8 g/dL — ABNORMAL LOW (ref 12.0–15.0)

## 2022-06-23 NOTE — Evaluation (Signed)
Occupational Therapy Evaluation Patient Details Name: [name] MRN: [id number] DOB: Sep 23, 1932 Today's Date: 06/23/2022   History of Present Illness Patient is a 88 year old female who presented with a right shoulder rorato cuff tear arthropathy. Patient underwent a right reverse shoulder replacement on 11/10. PMH::L and R TKA, L shoulder replacement, stroke, GERD, CA.   Clinical Impression   Patient is a 88 year old female who was admitted for above. Patient was noted to have decreased O2 saturation on RA with patient dropping to 75% on RA with functional mobility. Patient was noted to progress mobility comfort from unsteady without AD to min guard with quad cane during session. Patient was noted to required max A for sling adjustment with education provided on ice machine, sling positioning, pillow placement, and deep breathing strategies to increased independent in ADLs. Patient would continue to benefit from skilled OT services at this time while admitted and after d/c to address noted deficits in order to improve overall safety and independence in ADLs.       Recommendations for follow up therapy are one component of a multi-disciplinary discharge planning process, led by the attending physician.  Recommendations may be updated based on patient status, additional functional criteria and insurance authorization.   Follow Up Recommendations  Follow physician's recommendations for discharge plan and follow up therapies    Assistance Recommended at Discharge Frequent or constant Supervision/Assistance  Patient can return home with the following A lot of help with bathing/dressing/bathroom;A little help with walking and/or transfers;Assistance with cooking/housework;Direct supervision/assist for financial management;Help with stairs or ramp for entrance;Assist for transportation    Functional Status Assessment  Patient has had a recent decline in their functional status and demonstrates  the ability to make significant improvements in function in a reasonable and predictable amount of time.  Equipment Recommendations       Recommendations for Other Services       Precautions / Restrictions Precautions Precautions: Shoulder Type of Shoulder Precautions: NO ROM shoulder, Ok for AROM hand wrist and elbow Shoulder Interventions: Shoulder sling/immobilizer;Off for dressing/bathing/exercises;At all times Precaution Booklet Issued: Yes (comment) (handout) Restrictions Weight Bearing Restrictions: Yes RUE Weight Bearing: Non weight bearing      Mobility Bed Mobility Overal bed mobility: Needs Assistance Bed Mobility: Supine to Sit     Supine to sit: Min guard     General bed mobility comments: with HOB raised to L side    Transfers                          Balance Overall balance assessment: Mild deficits observed, not formally tested                                         ADL either performed or assessed with clinical judgement    Praxis      Pertinent Vitals/Pain Pain Assessment Pain Assessment: Faces Faces Pain Scale: Hurts a little bit Pain Location: pins and needles in hand Pain Descriptors / Indicators: Tingling Pain Intervention(s): Limited activity within patient's tolerance, Monitored during session     Hand Dominance Right   Extremity/Trunk Assessment Upper Extremity Assessment Upper Extremity Assessment: RUE deficits/detail RUE Deficits / Details: no shoulder ROM per MD orders with recent shoulder surgery. patient reported tingling in digits with ability to wiggle them   Lower Extremity Assessment Lower Extremity Assessment:  Overall [facility] for tasks assessed   Cervical / Trunk Assessment Cervical / Trunk Assessment: Normal   Communication Communication Communication: No difficulties   Cognition Arousal/Alertness: Awake/alert Behavior During Therapy: WFL for tasks assessed/performed Overall Cognitive  Status: Within Functional Limits for tasks assessed                                 General Comments: noted to have moments of repeating questions during session but appropriate with all tasks.     General Comments       Exercises     Shoulder Instructions Shoulder Instructions Donning/doffing shirt without moving shoulder: Maximal assistance Donning/doffing sling/immobilizer: Maximal assistance Correct positioning of sling/immobilizer: Maximal assistance Proper positioning of operated UE when showering: Maximal assistance Positioning of UE while sleeping: Maximal assistance    Home Living Family/patient expects to be discharged to:: Private residence Living Arrangements: Alone Available Help at Discharge: Family;Friend(s);Available 24 hours/day (for the first two days then at night for a few nights) Type of Home: House Home Access: Stairs to enter     Home Layout: One level               Home Equipment: Conservation officer, nature (2 wheels);Cane - single point          Prior Functioning/Environment Prior Level of Function : Independent/Modified Independent               OT Problem List: Impaired UE functional use;Decreased knowledge of use of DME or AE;Cardiopulmonary status limiting activity;Decreased knowledge of precautions;Impaired balance (sitting and/or standing);Decreased activity tolerance      OT Treatment/Interventions: Self-care/ADL training;Energy conservation;Neuromuscular education;Patient/family education;Therapeutic activities    OT Goals(Current goals can be found in the care plan section) Acute Rehab OT Goals Patient Stated Goal: to get home today OT Goal Formulation: With patient Time For Goal Achievement: 07/07/22 Potential to Achieve Goals: Fair  OT Frequency: Min 2X/week       AM-PAC OT "6 Clicks" Daily Activity     Outcome Measure Help from another person eating meals?: A Little Help from another person taking care of personal  grooming?: A Little Help from another person toileting, which includes using toliet, bedpan, or urinal?: A Little Help from another person bathing (including washing, rinsing, drying)?: A Lot Help from another person to put on and taking off regular upper body clothing?: A Lot Help from another person to put on and taking off regular lower body clothing?: A Little 6 Click Score: 16   End of Session Equipment Utilized During Treatment: Gait belt;Other (comment) (sling) Nurse Communication: Other (comment) (request for throat lozenges and O2 during session)  Activity Tolerance: Patient tolerated treatment well Patient left: in chair;with call bell/phone within reach;with nursing/sitter in room  OT Visit Diagnosis: Unsteadiness on feet (R26.81);Other abnormalities of gait and mobility (R26.89)                Time: 6440-3474 OT Time Calculation (min): 50 min Charges:  OT General Charges $OT Visit: 1 Visit OT Evaluation $OT Eval Moderate Complexity: 1 Mod OT Treatments $Self Care/Home Management : 23-37 mins  Rennie Plowman, MS Acute Rehabilitation Department Office# [phone]   Feliz Beam Dennis Killilea 06/23/2022, 9:00 AM

## 2022-06-23 NOTE — Plan of Care (Signed)
  Problem: Education: Goal: Knowledge of General Education information will improve Description Including pain rating scale, medication(s)/side effects and non-pharmacologic comfort measures Outcome: Progressing   

## 2022-06-23 NOTE — Progress Notes (Signed)
   Subjective:  [name] is a 88 y.o. female, 1 Day Post-Op    s/p Procedure(s): REVERSE total SHOULDER ARTHROPLASTY   Patient reports pain as mild.  Patient reports block is still active, was able to the bathroom and pass gas.  Denies nausea, vomiting, shortness of breath.  Working with OT this morning.  Able to move her hand, unable to actively move the elbow.  Feels like she is ready to go home.  Plan to go home with the help of a neighbor for the next 2 days and her children are close by in a near town.  Objective:   VITALS:   Vitals:   06/22/22 1949 06/22/22 2120 06/23/22 0037 06/23/22 0455  BP: 132/68 100/72 (!) 136/59 (!) 162/74  Pulse: 92 88 86 84  Resp: '16 16 16 16  '$ Temp: 98.3 F (36.8 C) (!) 97.5 F (36.4 C) 98 F (36.7 C) 98.6 F (37 C)  TempSrc: Oral Oral Oral Oral  SpO2:  94% 94% 94%  Weight:      Height:       In hospital bed sitting up no acute distress.:  Right upper extremity: Neurologically intact ABD soft Neurovascular intact Intact pulses distally Incision: dressing C/D/I Compartment soft Incision healing without signs of infection.  Dressing removed and Aquacel placed.  Able to make a fist actively, unable to fully flex the elbow.  Sling present.  No tenderness to the bilateral calves, calves soft.  Lab Results  Component Value Date   WBC 6.8 06/13/2022   HGB 10.8 (L) 06/23/2022   HCT 34.2 (L) 06/23/2022   MCV 93.1 06/13/2022   PLT 192 06/13/2022   BMET    Component Value Date/Time   NA 139 06/23/2022 0400   K 3.7 06/23/2022 0400   CL 108 06/23/2022 0400   CO2 27 06/23/2022 0400   GLUCOSE 165 (H) 06/23/2022 0400   BUN 14 06/23/2022 0400   CREATININE 0.80 06/23/2022 0400   CALCIUM 9.6 06/23/2022 0400   GFRNONAA >60 06/23/2022 0400     Assessment/Plan: 1 Day Post-Op   Principal Problem:   S/P shoulder replacement, right   Advance diet Up with therapy  Dispo: Pending OT evaluation can likely discharge today.    Dressing  removed and Aquacel given to patient  Weightbearing Status: Nonweightbearing right upper extremity, okay for elbow, wrist, and digit range of motion.  Okay for scapular retractions. DVT Prophylaxis: Aspirin   Faythe Casa 06/23/2022, 8:36 AM  Jonelle Sidle PA-C  Physician Assistant with Dr. Lillia Abed [facility]

## 2022-06-23 NOTE — Progress Notes (Signed)
Occupational Therapy Treatment Patient Details Name: [name] MRN: [id number] DOB: [birth year] Today's Date: 06/23/2022   History of present illness Patient is a 88 year old female who presented with a right shoulder rorato cuff tear arthropathy. Patient underwent a right reverse shoulder replacement on 11/10. PMH::L and R TKA, L shoulder replacement, stroke, GERD, CA.   OT comments  Patient and family were present during second session today. Patient was noted to have confusion on when AM session occurred and noted to have poor carryover or memory of education provided this AM. Patient was educated on using incentive spirometer appropriately with Supervision needed to complete appropriated. Patients son and daughter in law were educated on cryocuff use, shoulder precautions, proper positioning of sling and pillow positioning to maintain ROM and WB restrictions for RUE. Patients daughter in law and son verbalized understanding. Patient continued to have O2 drop to 86% on RA with functional mobility with continued education on deep breathing strategies. Patient was unable to complete pursed lip breathing technique with verbal and visual demonstration. Family training was scheduled for 11/12.    Recommendations for follow up therapy are one component of a multi-disciplinary discharge planning process, led by the attending physician.  Recommendations may be updated based on patient status, additional functional criteria and insurance authorization.    Follow Up Recommendations  Follow physician's recommendations for discharge plan and follow up therapies    Assistance Recommended at Discharge Frequent or constant Supervision/Assistance  Patient can return home with the following  A lot of help with bathing/dressing/bathroom;A little help with walking and/or transfers;Assistance with cooking/housework;Direct supervision/assist for financial management;Help with stairs or ramp for entrance;Assist for  transportation   Equipment Recommendations       Recommendations for Other Services      Precautions / Restrictions Precautions Precautions: Shoulder Type of Shoulder Precautions: NO ROM shoulder, Ok for AROM hand wrist and elbow Shoulder Interventions: Shoulder sling/immobilizer;Off for dressing/bathing/exercises;At all times Precaution Booklet Issued: Yes (comment) Restrictions Weight Bearing Restrictions: Yes RUE Weight Bearing: Non weight bearing       Mobility Bed Mobility Overal bed mobility: Needs Assistance Bed Mobility: Supine to Sit     Supine to sit: Min guard     General bed mobility comments: with HOB raised to L side            ADL either performed or assessed with clinical judgement   ADL Overall ADL's : Needs assistance/impaired               General ADL Comments: patients children were present during session. patient was noted to be 98% on RA at rest in bed. patient was noted to drop to 86% during functional mobiltiy with patient educated on deep breathing stragegies. patient was noted to have difficulty with pursed lip breathign during session even with family demonstrating for patient. patient was noted to have difficulty participating in more than one task while completeing functional mobility in hallway with patient either able to focus on standing balance or breathing but not both. patient was noted to have one LOB near room with min A to maintain standing. patient and family were educated on ice cuff and importance of its use. patient and family verbalized understnading. family was edcuated on concerns for patient transitioning home without 24/7 support. patietns daughter in law reported that they would work on helping patient in next level of care and providing what she needed. patient was noted to have continued confusion during education and had a  hard time carrying over ed during session. PA messaged on secure chat as per request. nurse made  aware of patietns O2 saturation during session.    Extremity/Trunk Assessment Upper Extremity Assessment RUE Deficits / Details: patient demonstrated increased difficulty with keeping RUE at side and keeps raising arm in defencive mannor with all movements, extensive education provided to patient and family during session.             Cognition Arousal/Alertness: Awake/alert Behavior During Therapy: WFL for tasks assessed/performed                     General Comments: patient was noted to have no memory of session earlier on this date. patients two children were presnet in room at this time and were able to assist therapist in orienting patient to session earlier this AM. patient was noted to have some confusion with following directions during session and diffiuclty with sequencing deep breathing strategies.        Exercises      Shoulder Instructions Shoulder Instructions Correct positioning of sling/immobilizer: Caregiver independent with task;Maximal assistance (patient was unable to adjsut sling herself and had increased difficulty with keeping arm relaxed in sling)     General Comments      Pertinent Vitals/ Pain       Pain Assessment Pain Assessment: Faces Faces Pain Scale: Hurts a little bit Pain Location: pain in R shoulder. Pain Descriptors / Indicators: Discomfort, Grimacing Pain Intervention(s): Monitored during session, Limited activity within patient's tolerance, Premedicated before session   Frequency  Min 2X/week        Progress Toward Goals  OT Goals(current goals can now be found in the care plan section)  Progress towards OT goals: Progressing toward goals     Plan Discharge plan remains appropriate       AM-PAC OT "6 Clicks" Daily Activity     Outcome Measure   Help from another person eating meals?: A Little Help from another person taking care of personal grooming?: A Little Help from another person toileting, which includes  using toliet, bedpan, or urinal?: A Little Help from another person bathing (including washing, rinsing, drying)?: A Lot Help from another person to put on and taking off regular upper body clothing?: A Lot Help from another person to put on and taking off regular lower body clothing?: A Little 6 Click Score: 16    End of Session Equipment Utilized During Treatment: Gait belt;Other (comment) (sling)  OT Visit Diagnosis: Unsteadiness on feet (R26.81);Other abnormalities of gait and mobility (R26.89)   Activity Tolerance Patient tolerated treatment well   Patient Left in chair;with call bell/phone within reach;with family/visitor present   Nurse Communication Other (comment) (patients participation during session)        Time: 7494-4967 OT Time Calculation (min): 44 min  Charges: OT General Charges $OT Visit: 1 Visit OT Treatments $Self Care/Home Management : 38-52 mins  Rennie Plowman, MS Acute Rehabilitation Department Office# [phone]   Marcellina Millin 06/23/2022, 5:28 PM

## 2022-06-24 DIAGNOSIS — Z96612 Presence of left artificial shoulder joint: Secondary | ICD-10-CM | POA: Diagnosis not present

## 2022-06-24 DIAGNOSIS — Z79899 Other long term (current) drug therapy: Secondary | ICD-10-CM | POA: Diagnosis not present

## 2022-06-24 DIAGNOSIS — Z85828 Personal history of other malignant neoplasm of skin: Secondary | ICD-10-CM | POA: Diagnosis not present

## 2022-06-24 DIAGNOSIS — M75101 Unspecified rotator cuff tear or rupture of right shoulder, not specified as traumatic: Secondary | ICD-10-CM | POA: Diagnosis not present

## 2022-06-24 DIAGNOSIS — I10 Essential (primary) hypertension: Secondary | ICD-10-CM | POA: Diagnosis not present

## 2022-06-24 DIAGNOSIS — Z8673 Personal history of transient ischemic attack (TIA), and cerebral infarction without residual deficits: Secondary | ICD-10-CM | POA: Diagnosis not present

## 2022-06-24 DIAGNOSIS — Z96653 Presence of artificial knee joint, bilateral: Secondary | ICD-10-CM | POA: Diagnosis not present

## 2022-06-24 DIAGNOSIS — I480 Paroxysmal atrial fibrillation: Secondary | ICD-10-CM | POA: Diagnosis not present

## 2022-06-24 DIAGNOSIS — Z7982 Long term (current) use of aspirin: Secondary | ICD-10-CM | POA: Diagnosis not present

## 2022-06-24 MED ORDER — POLYETHYLENE GLYCOL 3350 17 G PO PACK: 17.0000 g | PACK | Freq: Every day | ORAL | 0 refills | Status: AC | PRN

## 2022-06-24 MED ORDER — DOCUSATE SODIUM 100 MG PO CAPS: 100.0000 mg | ORAL_CAPSULE | Freq: Two times a day (BID) | ORAL | 0 refills | Status: AC

## 2022-06-24 MED ORDER — ONDANSETRON HCL 4 MG PO TABS: 4.0000 mg | ORAL_TABLET | Freq: Four times a day (QID) | ORAL | 0 refills | Status: AC | PRN

## 2022-06-24 NOTE — Progress Notes (Signed)
    Subjective:  Patient reports pain as mild.  Denies N/V/CP/SOB. States she is ready to go home.  Objective:   VITALS:   Vitals:   06/23/22 1518 06/23/22 2027 06/24/22 0401 06/24/22 0526  BP: (!) 154/74 (!) 141/56  (!) 127/51  Pulse: 79 88  74  Resp: '18 20  20  '$ Temp: 99.5 F (37.5 C) 99.9 F (37.7 C) 98.3 F (36.8 C) (!) 97.4 F (36.3 C)  TempSrc: Oral Oral Oral Oral  SpO2: 100% 97%  96%  Weight:      Height:        NAD RUE: sling in place. Aquacel c/d/I. (+) AIN, PIN, U. 2+ radial. SILT Ax, MC, R, U, M.  Lab Results  Component Value Date   WBC 6.8 06/13/2022   HGB 10.8 (L) 06/23/2022   HCT 34.2 (L) 06/23/2022   MCV 93.1 06/13/2022   PLT 192 06/13/2022   BMET    Component Value Date/Time   NA 139 06/23/2022 0400   K 3.7 06/23/2022 0400   CL 108 06/23/2022 0400   CO2 27 06/23/2022 0400   GLUCOSE 165 (H) 06/23/2022 0400   BUN 14 06/23/2022 0400   CREATININE 0.80 06/23/2022 0400   CALCIUM 9.6 06/23/2022 0400   GFRNONAA >60 06/23/2022 0400     Assessment/Plan: 2 Days Post-Op   Principal Problem:   S/P shoulder replacement, right   NWB, Sling RUE DVT ppx: Aspirin, SCDs, TEDS PO pain control OT Dispo: D/C home   Hilton Cork Shivam Mestas 06/24/2022, 8:37 AM   Rod Can, MD [phone] [place] [facility] now [facility]  [facility] [address] [place], [place], [place] [address] Phone: [phone] [email or web address] Facebook  [employer]

## 2022-06-24 NOTE — Plan of Care (Signed)
  Problem: Education: Goal: Knowledge of General Education information will improve Description: Including pain rating scale, medication(s)/side effects and non-pharmacologic comfort measures Outcome: Progressing   Problem: Health Behavior/Discharge Planning: Goal: Ability to manage health-related needs will improve Outcome: Progressing   Problem: Activity: Goal: Risk for activity intolerance will decrease Outcome: Progressing   

## 2022-06-24 NOTE — Progress Notes (Signed)
Family concerned about patient not having help at home after 2 days. Transitions of Care consult put in. Social worker has been Nature conservation officer to contact the patient's family. Family still waiting on OT to talk to patient and family as well.

## 2022-06-24 NOTE — Progress Notes (Signed)
Notified Dr. Lyla Glassing, MD that patient's family is not ready for patient to be discharged home. Family not comfortable with caring for patient at home at this time. Transitions of care consult was put in. PT eval order put in per Dr. Lyla Glassing, MD verbal order. Per Cloyde Reams, OT, patient's 02 sat dropped to 86% on room air while walking 250 feet. Made Dr. Lyla Glassing, MD aware of patient's progress. Today's discharge order canceled per Dr. Lyla Glassing, MD verbal order. Will continue to monitor patient.

## 2022-06-24 NOTE — Discharge Summary (Signed)
Physician Discharge Summary  Patient ID: [name] MRN: [id number] DOB/AGE: Dec 17, 1932 88 y.o.  Admit date: 06/22/2022 Discharge date: 06/24/2022  Admission Diagnoses:  S/P shoulder replacement, right  Discharge Diagnoses:  Principal Problem:   S/P shoulder replacement, right   Past Medical History:  Diagnosis Date   Arthritis    Cancer ([place])    skin cancer moles removed 4742   Complication of anesthesia    hyper ventilating and spinal did not work june 05-2102   GERD (gastroesophageal reflux disease)    Heart murmur    mild, no cardiologist   Hypertension    PAF (paroxysmal atrial fibrillation) ([place])    Stroke ([place])    right inferior occipital lobe infarct treated with Aspirin    Surgeries: Procedure(s): REVERSE total SHOULDER ARTHROPLASTY on 06/22/2022   Consultants (if any):   Discharged Condition: Improved  Hospital Course: [name] is an 88 y.o. female who was admitted 06/22/2022 with a diagnosis of S/P shoulder replacement, right and went to the operating room on 06/22/2022 and underwent the above named procedures.    She was given perioperative antibiotics:  Anti-infectives (From admission, onward)    Start     Dose/Rate Route Frequency Ordered Stop   06/22/22 1830  ceFAZolin (ANCEF) IVPB 2g/100 mL premix        2 g 200 mL/hr over 30 Minutes Intravenous Every 6 hours 06/22/22 1738 06/23/22 0725   06/22/22 0915  ceFAZolin (ANCEF) IVPB 2g/100 mL premix        2 g 200 mL/hr over 30 Minutes Intravenous On call to O.R. 06/22/22 0909 06/22/22 1228   06/22/22 0915  ceFAZolin (ANCEF) IVPB 2g/100 mL premix  Status:  Discontinued        2 g 200 mL/hr over 30 Minutes Intravenous On call to O.R. 06/22/22 0909 06/22/22 0910       Sling, NWB RUE.   She was given sequential compression devices, early ambulation, and ASA for DVT prophylaxis.  Patient cleared OT.  She benefited maximally from the hospital stay and there were no complications.    Recent  vital signs:  Vitals:   06/24/22 0401 06/24/22 0526  BP:  (!) 127/51  Pulse:  74  Resp:  20  Temp: 98.3 F (36.8 C) (!) 97.4 F (36.3 C)  SpO2:  96%    Recent laboratory studies:  Lab Results  Component Value Date   HGB 10.8 (L) 06/23/2022   HGB 12.4 06/13/2022   HGB 12.4 04/12/2022   Lab Results  Component Value Date   WBC 6.8 06/13/2022   PLT 192 06/13/2022   Lab Results  Component Value Date   INR 0.95 05/08/2012   Lab Results  Component Value Date   NA 139 06/23/2022   K 3.7 06/23/2022   CL 108 06/23/2022   CO2 27 06/23/2022   BUN 14 06/23/2022   CREATININE 0.80 06/23/2022   GLUCOSE 165 (H) 06/23/2022     Allergies as of 06/24/2022   No Known Allergies      Medication List     TAKE these medications    amLODipine 5 MG tablet Commonly known as: NORVASC Take 5 mg by mouth daily.   aspirin EC 325 MG tablet Take 325 mg by mouth daily.   atorvastatin 40 MG tablet Commonly known as: LIPITOR Take 40 mg by mouth at bedtime.   b complex vitamins capsule Take 1 capsule by mouth daily. Folic acid Vitamin C   carboxymethylcellulose 0.5 % Soln  Commonly known as: REFRESH PLUS Place 1 drop into both eyes 3 (three) times daily as needed (dry eyes).   cholecalciferol 25 MCG (1000 UNIT) tablet Commonly known as: VITAMIN D3 Take 1,000 Units by mouth daily.   diphenhydramine-acetaminophen 25-500 MG Tabs tablet Commonly known as: TYLENOL PM Take 1 tablet by mouth at bedtime.   docusate sodium 100 MG capsule Commonly known as: COLACE Take 1 capsule (100 mg total) by mouth 2 (two) times daily.   DULoxetine 60 MG capsule Commonly known as: CYMBALTA Take 60 mg by mouth daily.   hydrALAZINE 25 MG tablet Commonly known as: APRESOLINE Take 25 mg by mouth 3 (three) times daily.   HYDROcodone-acetaminophen 5-325 MG tablet Commonly known as: Norco Take 0.5 tablets by mouth every 6 (six) hours as needed for moderate pain or severe pain.   losartan 100  MG tablet Commonly known as: COZAAR Take 100 mg by mouth daily.   multivitamin with minerals Tabs tablet Take 1 tablet by mouth daily. Woman's   omeprazole 20 MG capsule Commonly known as: PRILOSEC Take 20 mg by mouth every morning.   ondansetron 4 MG tablet Commonly known as: ZOFRAN Take 1 tablet (4 mg total) by mouth every 6 (six) hours as needed for nausea.   polyethylene glycol 17 g packet Commonly known as: MiraLax Take 17 g by mouth daily as needed for moderate constipation or severe constipation.   Salonpas 3.08-18-08 % Ptch Generic drug: Camphor-Menthol-Methyl Sal Apply 1 patch topically 2 (two) times daily.   trolamine salicylate 10 % cream Commonly known as: ASPERCREME Apply 1 Application topically as needed (Shoulder pain).          WEIGHT BEARING   Other:  sling, NWB RUE.   EXERCISES  Daily exercises taught by OT.  CONSTIPATION  Constipation is defined medically as fewer than three stools per week and severe constipation as less than one stool per week.  Even if you have a regular bowel pattern at home, your normal regimen is likely to be disrupted due to multiple reasons following surgery.  Combination of anesthesia, postoperative narcotics, change in appetite and fluid intake all can affect your bowels.   YOU MUST use at least one of the following options; they are listed in order of increasing strength to get the job done.  They are all available over the counter, and you may need to use some, POSSIBLY even all of these options:    Drink plenty of fluids (prune juice may be helpful) and high fiber foods Colace 100 mg by mouth twice a day  Senokot for constipation as directed and as needed Dulcolax (bisacodyl), take with full glass of water  Miralax (polyethylene glycol) once or twice a day as needed.  If you have tried all these things and are unable to have a bowel movement in the first 3-4 days after surgery call either your surgeon or your primary  doctor.    If you experience loose stools or diarrhea, hold the medications until you stool forms back up.  If your symptoms do not get better within 1 week or if they get worse, check with your doctor.  If you experience "the worst abdominal pain ever" or develop nausea or vomiting, please contact the office immediately for further recommendations for treatment.   ITCHING:  If you experience itching with your medications, try taking only a single pain pill, or even half a pain pill at a time.  You can also use Benadryl over the counter  for itching or also to help with sleep.   TED HOSE STOCKINGS:  Use stockings on both legs until for at least 2 weeks or as directed by physician office. They may be removed at night for sleeping.  MEDICATIONS:  See your medication summary on the "After Visit Summary" that nursing will review with you.  You may have some home medications which will be placed on hold until you complete the course of blood thinner medication.  It is important for you to complete the blood thinner medication as prescribed.  PRECAUTIONS:  If you experience chest pain or shortness of breath - call 911 immediately for transfer to the hospital emergency department.   If you develop a fever greater that 101 F, purulent drainage from wound, increased redness or drainage from wound, foul odor from the wound/dressing, or calf pain - CONTACT YOUR SURGEON.                                                   FOLLOW-UP APPOINTMENTS:  If you do not already have a post-op appointment, please call the office for an appointment to be seen by your surgeon.  Guidelines for how soon to be seen are listed in your "After Visit Summary", but are typically between 1-4 weeks after surgery.  OTHER INSTRUCTIONS:   Knee Replacement:  Do not place pillow under knee, focus on keeping the knee straight while resting. CPM instructions: 0-90 degrees, 2 hours in the morning, 2 hours in the afternoon, and 2 hours in the  evening. Place foam block, curve side up under heel at all times except when in CPM or when walking.  DO NOT modify, tear, cut, or change the foam block in any way.   MAKE SURE YOU:  Understand these instructions.  Get help right away if you are not doing well or get worse.    Thank you for letting us be a part of your medical care team.  It is a privilege we respect greatly.  We hope these instructions will help you stay on track for a fast and full recovery!   Diagnostic Studies: DG Shoulder Right Port  Result Date: 06/22/2022 CLINICAL DATA:  Status post right shoulder arthroplasty. EXAM: RIGHT SHOULDER - 1 VIEW COMPARISON:  None Available. FINDINGS: Postoperative changes from reverse right total shoulder arthroplasty. Hardware components are in alignment. No signs of periprosthetic fracture or dislocation. IMPRESSION: Status post reverse right total shoulder arthroplasty. Electronically Signed   By: Kerby Moors M.D.   On: 06/22/2022 14:59    Disposition: Discharge disposition: 01-Home or Self Care       Discharge Instructions     Call MD / Call 911   Complete by: As directed    If you experience chest pain or shortness of breath, CALL 911 and be transported to the hospital emergency room.  If you develope a fever above 101 F, pus (white drainage) or increased drainage or redness at the wound, or calf pain, call your surgeon's office.   Constipation Prevention   Complete by: As directed    Drink plenty of fluids.  Prune juice may be helpful.  You may use a stool softener, such as Colace (over the counter) 100 mg twice a day.  Use MiraLax (over the counter) for constipation as needed.   Diet - low sodium heart healthy  Complete by: As directed    Discharge instructions   Complete by: As directed    See Dr. Veverly Fells' discharge instructions   Driving restrictions   Complete by: As directed    No driving   Increase activity slowly as tolerated   Complete by: As directed     Lifting restrictions   Complete by: As directed    No lifting   Post-operative opioid taper instructions:   Complete by: As directed    POST-OPERATIVE OPIOID TAPER INSTRUCTIONS: It is important to wean off of your opioid medication as soon as possible. If you do not need pain medication after your surgery it is ok to stop day one. Opioids include: Codeine, Hydrocodone(Norco, Vicodin), Oxycodone(Percocet, oxycontin) and hydromorphone amongst others.  Long term and even short term use of opiods can cause: Increased pain response Dependence Constipation Depression Respiratory depression And more.  Withdrawal symptoms can include Flu like symptoms Nausea, vomiting And more Techniques to manage these symptoms Hydrate well Eat regular healthy meals Stay active Use relaxation techniques(deep breathing, meditating, yoga) Do Not substitute Alcohol to help with tapering If you have been on opioids for less than two weeks and do not have pain than it is ok to stop all together.  Plan to wean off of opioids This plan should start within one week post op of your joint replacement. Maintain the same interval or time between taking each dose and first decrease the dose.  Cut the total daily intake of opioids by one tablet each day Next start to increase the time between doses. The last dose that should be eliminated is the evening dose.           Follow-up Information     Netta Cedars, MD. Call in 2 week(s).   Specialty: Orthopedic Surgery Why: call for appt [phone] Contact information: [address] [place] [address] [phone]                  Signed: Hilton Cork Mega Kinkade 06/24/2022, 8:43 AM

## 2022-06-24 NOTE — Evaluation (Addendum)
Physical Therapy Evaluation Patient Details Name: [name] MRN: [id number] DOB: [birth year] Today's Date: 06/24/2022  History of Present Illness  Patient is a 88 year old female who presented with a right shoulder rotator cuff tear arthropathy. Patient underwent a right reverse shoulder replacement on 11/10. PMH::L and R TKA, L shoulder replacement, stroke, GERD, CA.  Clinical Impression  Pt admitted with above diagnosis.  Pt demonstrates very unsteady gait experiencing LOB multiple times requiring min assist to recover during PT session. stability improved with HHA. SpO2=88% on RA with activity. Will review SPC/device next session.  Pt would need 24 hour assist/supervision at d/c. Family reportedly unable to provide and requesting SNF.    Pt currently with functional limitations due to the deficits listed below (see PT Problem List). Pt will benefit from skilled PT to increase their independence and safety with mobility to allow discharge to the venue listed below.          Recommendations for follow up therapy are one component of a multi-disciplinary discharge planning process, led by the attending physician.  Recommendations may be updated based on patient status, additional functional criteria and insurance authorization.  Follow Up Recommendations Skilled nursing-short term rehab (<3 hours/day) (family requests)--follow physician rec's      Assistance Recommended at Discharge Frequent or constant Supervision/Assistance  Patient can return home with the following  A little help with walking and/or transfers;A little help with bathing/dressing/bathroom;Assistance with cooking/housework;Assist for transportation;Help with stairs or ramp for entrance    Equipment Recommendations None recommended by PT  Recommendations for Other Services       Functional Status Assessment Patient has had a recent decline in their functional status and demonstrates the ability to make significant  improvements in function in a reasonable and predictable amount of time.     Precautions / Restrictions Precautions Precautions: Shoulder Type of Shoulder Precautions: NO ROM shoulder, Ok for AROM hand wrist and elbow Shoulder Interventions: Shoulder sling/immobilizer;Off for dressing/bathing/exercises;At all times Restrictions RUE Weight Bearing: Non weight bearing      Mobility  Bed Mobility   Bed Mobility: Sit to Supine       Sit to supine: Min guard   General bed mobility comments: cues to avoid use RLE, able to scoot up in supine with bed in boost mode    Transfers Overall transfer level: Needs assistance Equipment used: None Transfers: Sit to/from Stand Sit to Stand: Min guard, Min assist           General transfer comment: repeated STS with LOB x 2 with initial standing requiring min assist to recover. pt attempts to pick up object from floor, LOB needing min to mod assist to recover balance    Ambulation/Gait Ambulation/Gait assistance: Min assist, Min guard Gait Distance (Feet):  (hallway ambulation) Assistive device: 1 person hand held assist, None Gait Pattern/deviations: Step-through pattern, Drifts right/left, Narrow base of support       General Gait Details: extremely unsteady gait with LOB x3 requiring min assist to recover. balance improved with HHA and min/guard for safety  Stairs            Wheelchair Mobility    Modified Rankin (Stroke Patients Only)       Balance Overall balance assessment: Needs assistance         Standing balance support: No upper extremity supported, During functional activity, Single extremity supported Standing balance-Leahy Scale: Poor Standing balance comment: reliant on UE support for dynamic tasks  High level balance activites: Backward walking, Turns, Head turns High Level Balance Comments: min assist for higher level balance             Pertinent Vitals/Pain Pain  Assessment Pain Assessment: No/denies pain Pain Location: pt reports block has warn off but pain meds are working Pain Intervention(s): Monitored during session    [place] expects to be discharged to:: Private residence Living Arrangements: Alone Available Help at Discharge: Family;Friend(s);Available 24 hours/day Type of Home: House Home Access: Stairs to enter Entrance Stairs-Rails: None Entrance Stairs-Number of Steps: 1   Home Layout: One level Home Equipment: Conservation officer, nature (2 wheels);Cane - single point      Prior Function Prior Level of Function : Independent/Modified Independent                     Hand Dominance        Extremity/Trunk Assessment   Upper Extremity Assessment Upper Extremity Assessment: Defer to OT evaluation    Lower Extremity Assessment Lower Extremity Assessment: Overall WFL for tasks assessed       Communication   Communication: No difficulties  Cognition Arousal/Alertness: Awake/alert Behavior During Therapy: WFL for tasks assessed/performed Overall Cognitive Status: Within Functional Limits for tasks assessed                                 General Comments: patient was noted to repeat self, requires to cues to adhere to shoulder precautions; verbalizes not using RLE however has difficulty following precautions consistently with functional tasks        General Comments      Exercises     Assessment/Plan    PT Assessment Patient needs continued PT services  PT Problem List Decreased activity tolerance;Decreased mobility;Decreased balance;Decreased knowledge of use of DME;Decreased knowledge of precautions;Decreased safety awareness       PT Treatment Interventions DME instruction;Therapeutic exercise;Gait training;Functional mobility training;Therapeutic activities;Patient/family education;Balance training    PT Goals (Current goals can be found in the Care Plan section)  Acute Rehab  PT Goals Patient Stated Goal: my family wants me to go tho the nursing home PT Goal Formulation: With patient Time For Goal Achievement: 07/08/22 Potential to Achieve Goals: Good    Frequency Min 3X/week     Co-evaluation               AM-PAC PT "6 Clicks" Mobility  Outcome Measure Help needed turning from your back to your side while in a flat bed without using bedrails?: A Little Help needed moving from lying on your back to sitting on the side of a flat bed without using bedrails?: A Little Help needed moving to and from a bed to a chair (including a wheelchair)?: A Little Help needed standing up from a chair using your arms (e.g., wheelchair or bedside chair)?: A Little Help needed to walk in hospital room?: A Little Help needed climbing 3-5 steps with a railing? : A Little 6 Click Score: 18    End of Session Equipment Utilized During Treatment: Gait belt Activity Tolerance: Patient tolerated treatment well Patient left: in bed;with call bell/phone within reach;with bed alarm set Nurse Communication: Mobility status PT Visit Diagnosis: Other abnormalities of gait and mobility (R26.89);Difficulty in walking, not elsewhere classified (R26.2)    Time: 9371-6967 PT Time Calculation (min) (ACUTE ONLY): 19 min   Charges:   PT Evaluation $PT Eval Low Complexity: 1 Low  Baxter Flattery, PT  Acute Rehab Dept [facility]) [phone]  [facility] Weekend Pager [facility] only)  [phone]  06/24/2022   [facility] 06/24/2022, 4:38 PM

## 2022-06-24 NOTE — Progress Notes (Signed)
Please call [phone]

## 2022-06-24 NOTE — Progress Notes (Signed)
Occupational Therapy Treatment Patient Details Name: [name] MRN: [id number] DOB: [birth year] Today's Date: 06/24/2022   History of present illness Patient is a 88 year old female who presented with a right shoulder rorato cuff tear arthropathy. Patient underwent a right reverse shoulder replacement on 11/10. PMH::L and R TKA, L shoulder replacement, stroke, GERD, CA.   OT comments  Patient was noted to have some improvement over O2 saturation with patient able to make it half way around unit until O2 saturation transitioned to 86% on RA. Patient needed to sit to recover on RA with patient unable to deep breath and walk at the same time. Patient continues to have difficulty with pursed lip breathing despite education and demonstration.patient also has difficult time with maintaining ROM and WB restrictions with patient unable to carryover education provided.  Patient's family expressed concerns over patient transitioning home at this time with son and daughter in law reporting they are unable to physically assist. Patient and family were educated that patient needed supervision and cues to ensure she was following the MD ROM and WB restrictions during tasks but physically was able to complete tasks other than bathing bilateral underarms. Patient's family inquired about additional services in next level of care. Questions were directed to Ortho MD office and social worker at this time. Patient would continue to benefit from skilled OT services at this time while admitted and after d/c to address noted deficits in order to improve overall safety and independence in ADLs.     Recommendations for follow up therapy are one component of a multi-disciplinary discharge planning process, led by the attending physician.  Recommendations may be updated based on patient status, additional functional criteria and insurance authorization.    Follow Up Recommendations  Follow physician's recommendations for  discharge plan and follow up therapies    Assistance Recommended at Discharge Frequent or constant Supervision/Assistance  Patient can return home with the following  A little help with bathing/dressing/bathroom;A little help with walking and/or transfers;Assistance with cooking/housework;Direct supervision/assist for medications management;Direct supervision/assist for financial management;Help with stairs or ramp for entrance;Assist for transportation   Equipment Recommendations       Recommendations for Other Services      Precautions / Restrictions Precautions Precautions: Shoulder Type of Shoulder Precautions: NO ROM shoulder, Ok for AROM hand wrist and elbow Shoulder Interventions: Shoulder sling/immobilizer;Off for dressing/bathing/exercises;At all times Precaution Booklet Issued: Yes (comment) Restrictions Weight Bearing Restrictions: Yes RUE Weight Bearing: Non weight bearing       Mobility Bed Mobility               General bed mobility comments: up in recliner and returned to the same.    Transfers                         Balance                                           ADL either performed or assessed with clinical judgement   ADL Overall ADL's : Needs assistance/impaired                     Lower Body Dressing: Minimal assistance Lower Body Dressing Details (indicate cue type and reason): to doff socks with patient unable to bring BLE to lap without leaning on R shoulder. patient was educated  on importance of not leaning on R shoulder to maintain precautions. patient noted to hold shoulder in abduction at rest with education provided on proper positioning consistently during session. patient at end of session was able to verbalize that she was not supposed to move her shoulder and keep her elbow touching her trunk   Toilet Transfer Details (indicate cue type and reason): patient was supervision for transfers in room  and functional mobility in hallway. patient was noted to drop to 86% on RA half way around unit. patietn was unable to recover while walking and needed extensive time to come back up while standing. once seated in recliner patietn was able to bring O2 saturation back up under 1 min. patient remains a symtomatic at this time to lower o2 levels. patient had one LOB during mobility with staggering to R side near wall with min A to maintin standing balance. patient was educated on importance of focusing on one thing at a time while walking. patient is unable to deep breath and walk without instability and LOB.           General ADL Comments: patient's son and daughter in law were in room for training on this date. patient's daughter in law raised concerns over patient being able to transition home on this date. patient and family were educated on how patient is able to participate in functional tasks without physical assistance that concerns were more for her ability to maitnain ROM and WB restrictions of RUE. patient would need 24/7 supervision to ensure following restrictions with patient unable to carryover education into functional tasks during session. patients son verbalized understanding reporting that he and his wife are unable to assist with supervision at home and that they are requesting to transition to SNF at this time. patient's family was educated that this was up to ortho MD and since it was an elective surgery most of the time this was addressed prior to admission to [place]. patient's family verbalized understanding.    Extremity/Trunk Assessment Upper Extremity Assessment RUE Deficits / Details: patient has full ROM of hand wrist and elbow at this time.            Vision       Perception     Praxis      Cognition Arousal/Alertness: Awake/alert Behavior During Therapy: WFL for tasks assessed/performed Overall Cognitive Status: Within Functional Limits for tasks assessed                                  General Comments: patient was noted to repeat some during session but improved from previous day. patient remains unable to apply shoulder precautions to activity requiring consistent cues during all tasks.        Exercises      Shoulder Instructions Shoulder Instructions Donning/doffing sling/immobilizer: Maximal assistance Correct positioning of sling/immobilizer: Caregiver independent with task ROM for elbow, wrist and digits of operated UE: Supervision/safety (was able to compelte one rep of each exercises) Positioning of UE while sleeping: Moderate assistance     General Comments      Pertinent Vitals/ Pain       Pain Assessment Pain Assessment: 0-10 Faces Pain Scale: No hurt Pain Intervention(s): Premedicated before session  Home Living  Prior Functioning/Environment              Frequency  Min 2X/week        Progress Toward Goals  OT Goals(current goals can now be found in the care plan section)     Acute Rehab OT Goals Patient Stated Goal: to get better OT Goal Formulation: With patient/family Time For Goal Achievement: 07/08/22 Potential to Achieve Goals: Fair  Plan      Co-evaluation                 AM-PAC OT "6 Clicks" Daily Activity     Outcome Measure   Help from another person eating meals?: None Help from another person taking care of personal grooming?: A Little Help from another person toileting, which includes using toliet, bedpan, or urinal?: A Little Help from another person bathing (including washing, rinsing, drying)?: A Lot Help from another person to put on and taking off regular upper body clothing?: A Lot Help from another person to put on and taking off regular lower body clothing?: A Little 6 Click Score: 17    End of Session Equipment Utilized During Treatment: Other (comment) (sling)  OT Visit Diagnosis:  Unsteadiness on feet (R26.81);Other abnormalities of gait and mobility (R26.89)   Activity Tolerance Patient tolerated treatment well   Patient Left in chair;with call bell/phone within reach;with family/visitor present   Nurse Communication Other (comment) (familys concerns over d/c home today and therapy recommendations)        Time: 1101-1153 OT Time Calculation (min): 52 min  Charges: OT General Charges $OT Visit: 1 Visit OT Treatments $Self Care/Home Management : 38-52 mins  Rennie Plowman, MS Acute Rehabilitation Department Office# [phone]   [name] 06/24/2022, 1:30 PM

## 2022-06-25 ENCOUNTER — Encounter (HOSPITAL_COMMUNITY): Payer: Self-pay | Admitting: Orthopedic Surgery

## 2022-06-25 DIAGNOSIS — Z85828 Personal history of other malignant neoplasm of skin: Secondary | ICD-10-CM | POA: Diagnosis not present

## 2022-06-25 DIAGNOSIS — Z96612 Presence of left artificial shoulder joint: Secondary | ICD-10-CM | POA: Diagnosis not present

## 2022-06-25 DIAGNOSIS — I480 Paroxysmal atrial fibrillation: Secondary | ICD-10-CM | POA: Diagnosis not present

## 2022-06-25 DIAGNOSIS — I10 Essential (primary) hypertension: Secondary | ICD-10-CM | POA: Diagnosis not present

## 2022-06-25 DIAGNOSIS — M75101 Unspecified rotator cuff tear or rupture of right shoulder, not specified as traumatic: Secondary | ICD-10-CM | POA: Diagnosis not present

## 2022-06-25 DIAGNOSIS — Z79899 Other long term (current) drug therapy: Secondary | ICD-10-CM | POA: Diagnosis not present

## 2022-06-25 DIAGNOSIS — Z8673 Personal history of transient ischemic attack (TIA), and cerebral infarction without residual deficits: Secondary | ICD-10-CM | POA: Diagnosis not present

## 2022-06-25 DIAGNOSIS — Z96653 Presence of artificial knee joint, bilateral: Secondary | ICD-10-CM | POA: Diagnosis not present

## 2022-06-25 DIAGNOSIS — Z7982 Long term (current) use of aspirin: Secondary | ICD-10-CM | POA: Diagnosis not present

## 2022-06-25 NOTE — Plan of Care (Signed)

## 2022-06-25 NOTE — TOC Progression Note (Signed)
Transition of Care [facility]) - Progression Note    Patient Details  Name: [name] MRN: [id number] Date of Birth: 03/08/1933  [facility]) CM/SW Contact  Lennart Pall, LCSW Phone Number: 06/25/2022, 3:13 PM  Clinical Narrative:     Have reviewed SNF bed offers with pt and she has accepted bed at [facility] in [place].  Have started insurance authorization.  Have updated daughter-in-law as well.  Expected Discharge Plan: [place] Barriers to Discharge: Continued Medical Work up, SNF Pending bed offer, Ship broker  Expected Discharge Plan and Services Expected Discharge Plan: [place] In-house Referral: Clinical Social Work   Post Acute Care Choice: [place] Living arrangements for the past 2 months: [place] Expected Discharge Date: 06/26/22               DME Arranged: N/A DME Agency: NA                   Social Determinants of Health (SDOH) Interventions    Readmission Risk Interventions     No data to display

## 2022-06-25 NOTE — Progress Notes (Signed)
Occupational Therapy Treatment Patient Details Name: [name] MRN: [id number] DOB: 1933/07/26 Today's Date: 06/25/2022   History of present illness Patient is a 88 year old female who presented with a right shoulder rotator cuff tear arthropathy. Patient underwent a right reverse shoulder replacement on 11/10. PMH::L and R TKA, L shoulder replacement, stroke, GERD, CA.   OT comments  Patient was able to maintain O2 saturation 96% or above during ADL tasks standing at sink for bathing and dressing with consistent cues to be able to adhere to ROM and WB restrictions of RUE. Patient is unable to carryover and apply education provided. Patient will need 24/7 caregiver supervision at home to maintain ability to carryover and adhere to MD ROM and WB restrictions for RUE. Patient would continue to benefit from skilled OT services at this time while admitted and after d/c to address noted deficits in order to improve overall safety and independence in ADLs.     Recommendations for follow up therapy are one component of a multi-disciplinary discharge planning process, led by the attending physician.  Recommendations may be updated based on patient status, additional functional criteria and insurance authorization.    Follow Up Recommendations  Follow physician's recommendations for discharge plan and follow up therapies     Assistance Recommended at Discharge Frequent or constant Supervision/Assistance  Patient can return home with the following  A little help with bathing/dressing/bathroom;A little help with walking and/or transfers;Assistance with cooking/housework;Direct supervision/assist for medications management;Direct supervision/assist for financial management;Help with stairs or ramp for entrance;Assist for transportation   Equipment Recommendations       Recommendations for Other Services      Precautions / Restrictions Precautions Precautions: Shoulder Type of Shoulder  Precautions: NO ROM shoulder, Ok for AROM hand wrist and elbow Shoulder Interventions: Shoulder sling/immobilizer;Off for dressing/bathing/exercises;At all times Precaution Booklet Issued: Yes (comment) Restrictions Weight Bearing Restrictions: Yes RUE Weight Bearing: Non weight bearing       Mobility Bed Mobility Overal bed mobility: Needs Assistance Bed Mobility: Sit to Supine       Sit to supine: Min guard   General bed mobility comments: cues to avoid using RUE    Transfers                         Balance Overall balance assessment: Needs assistance         Standing balance support: No upper extremity supported, During functional activity, Single extremity supported Standing balance-Leahy Scale: Fair                             ADL either performed or assessed with clinical judgement   ADL Overall ADL's : Needs assistance/impaired     Grooming: Wash/dry face;Wash/dry hands;Standing;Cueing for UE precautions;Min guard Grooming Details (indicate cue type and reason): at sink Upper Body Bathing: Moderate assistance;Standing;Cueing for UE precautions Upper Body Bathing Details (indicate cue type and reason): at sink     Upper Body Dressing : Moderate assistance;Sitting   Lower Body Dressing: Moderate assistance;Sit to/from stand;Sitting/lateral leans Lower Body Dressing Details (indicate cue type and reason): to get mesh underwear in place and pulled up.                    Extremity/Trunk Assessment              Vision       Perception     Praxis  Cognition Arousal/Alertness: Awake/alert Behavior During Therapy: WFL for tasks assessed/performed Overall Cognitive Status: Within Functional Limits for tasks assessed                                 General Comments: patient was noted to repeat self, requires to cues to adhere to shoulder precautions; verbalizes not using RUE however has difficulty  following precautions consistently with functional tasks        Exercises      Shoulder Instructions       General Comments      Pertinent Vitals/ Pain       Pain Assessment Pain Assessment: No/denies pain  Home Living                                          Prior Functioning/Environment              Frequency  Min 2X/week        Progress Toward Goals  OT Goals(current goals can now be found in the care plan section)  Progress towards OT goals: Progressing toward goals     Plan Discharge plan remains appropriate    Co-evaluation                 AM-PAC OT "6 Clicks" Daily Activity     Outcome Measure   Help from another person eating meals?: None Help from another person taking care of personal grooming?: A Little Help from another person toileting, which includes using toliet, bedpan, or urinal?: A Little Help from another person bathing (including washing, rinsing, drying)?: A Lot Help from another person to put on and taking off regular upper body clothing?: A Lot Help from another person to put on and taking off regular lower body clothing?: A Little 6 Click Score: 17    End of Session Equipment Utilized During Treatment: Other (comment) (sling)  OT Visit Diagnosis: Unsteadiness on feet (R26.81);Other abnormalities of gait and mobility (R26.89)   Activity Tolerance Patient tolerated treatment well   Patient Left in bed;with call bell/phone within reach;with bed alarm set   Nurse Communication Other (comment) (d/c recommendations from therapy)        Time: 7681-1572 OT Time Calculation (min): 26 min  Charges: OT General Charges $OT Visit: 1 Visit OT Treatments $Self Care/Home Management : 23-37 mins  Rennie Plowman, MS Acute Rehabilitation Department Office# [phone]   Marcellina Millin 06/25/2022, 11:15 AM

## 2022-06-25 NOTE — TOC Initial Note (Signed)
Transition of Care [facility]) - Initial/Assessment Note    Patient Details  Name: [name] MRN: [id number] Date of Birth: 03/29/1933  [facility] CM/SW Contact:    Lennart Pall, LCSW Phone Number: 06/25/2022, 10:06 AM  Clinical Narrative:                 Met with pt and spoke with daughter-in-law, Gregary Signs this morning to review dc planning needs.  Pt confirms that she lives alone in [place] and her son and dtr-in-law are living in [place].  Pt and family feel that her current assistance needs are too great for family to manage as dtr-in-law just had surgery on her hand and son working full-time.  We discussed process for SNF regarding needing insurance approval.  Have begun SNF bed search.  Expected Discharge Plan: Skilled Nursing Facility Barriers to Discharge: Continued Medical Work up, SNF Pending bed offer, Insurance Authorization   Patient Goals and CMS Choice Patient states their goals for this hospitalization and ongoing recovery are:: return home following SNF rehab      Expected Discharge Plan and Services Expected Discharge Plan: [place] In-house Referral: Clinical Social Work   Post Acute Care Choice: [place] Living arrangements for the past 2 months: [place] Expected Discharge Date: 06/24/22               DME Arranged: N/A DME Agency: NA                  Prior Living Arrangements/Services Living arrangements for the past 2 months: Single Family Home Lives with:: Self Patient language and need for interpreter reviewed:: Yes        Need for Family Participation in Patient Care: Yes (Comment) Care giver support system in place?: No (comment)   Criminal Activity/Legal Involvement Pertinent to Current Situation/Hospitalization: No - Comment as needed  Activities of Daily Living Home Assistive Devices/Equipment: Eyeglasses ADL Screening (condition at time of admission) Patient's cognitive ability  adequate to safely complete daily activities?: Yes Is the patient deaf or have difficulty hearing?: No Does the patient have difficulty seeing, even when wearing glasses/contacts?: No Does the patient have difficulty concentrating, remembering, or making decisions?: No Patient able to express need for assistance with ADLs?: Yes Does the patient have difficulty dressing or bathing?: No Independently performs ADLs?: Yes (appropriate for developmental age) Does the patient have difficulty walking or climbing stairs?: Yes Weakness of Legs: None Weakness of Arms/Hands: Right  Permission Sought/Granted Permission sought to share information with : Family Supports Permission granted to share information with : Yes, Verbal Permission Granted  Share Information with NAME: Tiburcio Pea     Permission granted to share info w Relationship: daughter-in-law  Permission granted to share info w Contact Information: [phone]  Emotional Assessment Appearance:: Appears stated age Attitude/Demeanor/Rapport: Gracious Affect (typically observed): Accepting Orientation: : Oriented to Self, Oriented to Place, Oriented to  Time, Oriented to Situation Alcohol / Substance Use: Not Applicable Psych Involvement: No (comment)  Admission diagnosis:  S/P shoulder replacement, right [Z96.611] Patient Active Problem List   Diagnosis Date Noted   S/P shoulder replacement, right 06/22/2022   Expected blood loss anemia 05/13/2012   Hyponatremia 05/13/2012   Obese 05/13/2012   S/P right TKA 05/12/2012   PCP:  [place], [place]:   [id number] [id number]: [phone]: [phone]  Social Determinants of Health (SDOH) Interventions    Readmission Risk Interventions     No data to display           

## 2022-06-25 NOTE — Care Plan (Signed)
Ortho Bundle Case Management Note  Patient Details  Name: [name] MRN: [id number] Date of Birth: Sep 23, 1932                  L Rev TSA on 06/22/22. DCP: Home with friend. DME: Sling and CTU provided by hospital. PT: HEP   DME Arranged:  N/A DME Agency:  NA  HH Arranged:    [place] Agency:     Additional Comments: Please contact me with any questions of if this plan should need to change.  Dario Ave, Case Manager  [facility]  [phone] 06/25/2022, 2:28 PM

## 2022-06-25 NOTE — NC FL2 (Signed)
[place] LEVEL OF CARE SCREENING TOOL     IDENTIFICATION  Patient Name: [name] Birthdate: 1933/07/01 Sex: female Admission Date (Current Location): 06/22/2022  [place] and [place] Number:  Herbalist and Address:  [facility],  [place], [place]      Provider Number: [id number]  Attending Physician Name and Address:  Netta Cedars, MD  Relative Name and Phone Number:  daughter-in-law, Tiburcio Pea [phone]    Current Level of Care: Hospital Recommended Level of Care: [place] Prior Approval Number:    Date Approved/Denied: 06/24/22 PASRR Number: [id number]  Discharge Plan: SNF    Current Diagnoses: Patient Active Problem List   Diagnosis Date Noted   S/P shoulder replacement, right 06/22/2022   Expected blood loss anemia 05/13/2012   Hyponatremia 05/13/2012   Obese 05/13/2012   S/P right TKA 05/12/2012    Orientation RESPIRATION BLADDER Height & Weight     Self, Time, Situation, Place  Normal Continent Weight: 141 lb (64 kg) Height:  5' (152.4 cm)  BEHAVIORAL SYMPTOMS/MOOD NEUROLOGICAL BOWEL NUTRITION STATUS      Continent Diet (reg)  AMBULATORY STATUS COMMUNICATION OF NEEDS Skin   Limited Assist Verbally Other (Comment) (surgical incision)                       Personal Care Assistance Level of Assistance  Bathing, Dressing, Feeding Bathing Assistance: Limited assistance Feeding assistance: Limited assistance Dressing Assistance: Limited assistance     Functional Limitations [place]  PT (By licensed PT), OT (By licensed OT)     PT Frequency: 5x/wk OT Frequency: 5x/wk            Contractures Contractures Info: Not present    Additional Factors Info  Code Status, Allergies, Psychotropic Code Status Info: Full Allergies Info: NKDA Psychotropic Info: see MAR         Current Medications (06/25/2022):  This is  the current hospital active medication list Current Facility-Administered Medications  Medication Dose Route Frequency Provider Last Rate Last Admin   0.9 %  sodium chloride infusion   Intravenous Continuous Netta Cedars, MD   Stopped at 06/23/22 0725   acetaminophen (TYLENOL) tablet 325-650 mg  325-650 mg Oral Q6H PRN Netta Cedars, MD   650 mg at 06/25/22 0936   amLODipine (NORVASC) tablet 5 mg  5 mg Oral Daily Netta Cedars, MD   5 mg at 06/25/22 0932   aspirin EC tablet 325 mg  325 mg Oral Daily Netta Cedars, MD   325 mg at 06/25/22 0932   atorvastatin (LIPITOR) tablet 40 mg  40 mg Oral Donavan Burnet, MD   40 mg at 06/24/22 2102   B-complex with vitamin C tablet 1 tablet  1 tablet Oral Daily Netta Cedars, MD   1 tablet at 06/25/22 0932   cholecalciferol (VITAMIN D3) 25 MCG (1000 UNIT) tablet 1,000 Units  1,000 Units Oral Daily Netta Cedars, MD   1,000 Units at 06/25/22 0932   docusate sodium (COLACE) capsule 100 mg  100 mg Oral BID Netta Cedars, MD   100 mg at 06/25/22 0932   DULoxetine (CYMBALTA) DR capsule 60 mg  60 mg Oral Daily Netta Cedars, MD   60 mg at 06/25/22 0932   hydrALAZINE (APRESOLINE) tablet 25 mg  25 mg Oral TID Netta Cedars, MD   25 mg at  06/25/22 0931   HYDROcodone-acetaminophen (NORCO/VICODIN) 5-325 MG per tablet 1-2 tablet  1-2 tablet Oral Q4H PRN Netta Cedars, MD   1 tablet at 06/24/22 0914   losartan (COZAAR) tablet 100 mg  100 mg Oral Daily Netta Cedars, MD   100 mg at 06/25/22 0931   menthol-cetylpyridinium (CEPACOL) lozenge 3 mg  1 lozenge Oral PRN Netta Cedars, MD   3 mg at 06/24/22 1805   Or   phenol (CHLORASEPTIC) mouth spray 1 spray  1 spray Mouth/Throat PRN Netta Cedars, MD       metoCLOPramide (REGLAN) tablet 5-10 mg  5-10 mg Oral Q8H PRN Netta Cedars, MD       Or   metoCLOPramide (REGLAN) injection 5-10 mg  5-10 mg Intravenous Q8H PRN Netta Cedars, MD       morphine (PF) 2 MG/ML injection 0.5-1 mg  0.5-1 mg Intravenous Q2H PRN Netta Cedars,  MD       multivitamin with minerals tablet 1 tablet  1 tablet Oral Daily Netta Cedars, MD   1 tablet at 06/25/22 0932   Muscle Rub CREA   Topical PRN Netta Cedars, MD       ondansetron [facility]) tablet 4 mg  4 mg Oral Q6H PRN Netta Cedars, MD       Or   ondansetron [facility]) injection 4 mg  4 mg Intravenous Q6H PRN Netta Cedars, MD       pantoprazole (PROTONIX) EC tablet 40 mg  40 mg Oral Daily Netta Cedars, MD   40 mg at 06/25/22 0932   polyvinyl alcohol (LIQUIFILM TEARS) 1.4 % ophthalmic solution 1 drop  1 drop Both Eyes PRN Netta Cedars, MD         Discharge Medications: Please see discharge summary for a list of discharge medications.  Relevant Imaging Results:  Relevant Lab Results:   Additional Information SS# [id number]  Lennart Pall, LCSW

## 2022-06-25 NOTE — Progress Notes (Signed)
   Subjective: 3 Days Post-Op Procedure(s) (LRB): REVERSE total SHOULDER ARTHROPLASTY (Right)  Pt c/o minimal pain to right shoulder Otherwise doing well Therapy recommending SNF due to poor balance when up Social work attending to SNF needs Patient reports pain as mild.  Objective:   VITALS:   Vitals:   06/25/22 0548 06/25/22 0928  BP: (!) 154/61 128/67  Pulse: 93 78  Resp: 17 18  Temp: 98.8 F (37.1 C) 98.4 F (36.9 C)  SpO2: 94% 98%    Right shoulder incision healing well Nv intact distally No rashes or edema distally Sling in good position   LABS Recent Labs    06/23/22 0400  HGB 10.8*  HCT 34.2*    Recent Labs    06/23/22 0400  NA 139  K 3.7  BUN 14  CREATININE 0.80  GLUCOSE 165*     Assessment/Plan: 3 Days Post-Op Procedure(s) (LRB): REVERSE total SHOULDER ARTHROPLASTY (Right) Plan for d/c once SNF bed approved  Will continue to monitor her progress Otherwise continue PT/OT     Kathrynn Speed, [place] is now [facility]  [place] [address] [place], [place], [place] [address] Phone: [phone] [email or web address] Facebook  [employer]

## 2022-06-25 NOTE — Discharge Summary (Signed)
In most cases prophylactic antibiotics for Dental procdeures after total joint surgery are not necessary.  Exceptions are as follows:  1. History of prior total joint infection  2. Severely immunocompromised (Organ Transplant, cancer chemotherapy, Rheumatoid biologic meds such as [place])  3. Poorly controlled diabetes (A1C &gt; 8.0, blood glucose over 200)  If you have one of these conditions, contact your surgeon for an antibiotic prescription, prior to your dental procedure. Orthopedic Discharge Summary        Physician Discharge Summary  Patient ID: [name] MRN: [id number] DOB/AGE: [birth year] 88 y.o.  Admit date: 06/22/2022 Discharge date: 06/25/2022   Procedures:  Procedure(s) (LRB): REVERSE total SHOULDER ARTHROPLASTY (Right)  Attending Physician:  Dr. Esmond Plants  Admission Diagnoses:   right shoulder cuff arthropathy  Discharge Diagnoses:  right shoulder cuff arthropathy   Past Medical History:  Diagnosis Date   Arthritis    Cancer [facility])    skin cancer moles removed 3151   Complication of anesthesia    hyper ventilating and spinal did not work june 05-2102   GERD (gastroesophageal reflux disease)    Heart murmur    mild, no cardiologist   Hypertension    PAF (paroxysmal atrial fibrillation) ([place])    Stroke ([place])    right inferior occipital lobe infarct treated with Aspirin    PCP: [place], [place]   Discharged Condition: good  Hospital Course:  Patient underwent the above stated procedure on 06/22/2022. Patient tolerated the procedure well and brought to the recovery room in good condition and subsequently to the floor. Patient had an uncomplicated hospital course and was stable for discharge.   Disposition: Discharge disposition: 03-Skilled Nursing Facility      with follow up in 2 weeks    Follow-up Information     Netta Cedars, MD. Call in 2 week(s).   Specialty: Orthopedic Surgery Why: call for appt [phone]  [phone] Contact information: [address] [place] [address] [phone]                 Dental Antibiotics:  In most cases prophylactic antibiotics for Dental procdeures after total joint surgery are not necessary.  Exceptions are as follows:  1. History of prior total joint infection  2. Severely immunocompromised (Organ Transplant, cancer chemotherapy, Rheumatoid biologic meds such as [place])  3. Poorly controlled diabetes (A1C &gt; 8.0, blood glucose over 200)  If you have one of these conditions, contact your surgeon for an antibiotic prescription, prior to your dental procedure.  Discharge Instructions     Call MD / Call 911   Complete by: As directed    If you experience chest pain or shortness of breath, CALL 911 and be transported to the hospital emergency room.  If you develope a fever above 101 F, pus (white drainage) or increased drainage or redness at the wound, or calf pain, call your surgeon's office.   Call MD / Call 911   Complete by: As directed    If you experience chest pain or shortness of breath, CALL 911 and be transported to the hospital emergency room.  If you develope a fever above 101 F, pus (white drainage) or increased drainage or redness at the wound, or calf pain, call your surgeon's office.   Constipation Prevention   Complete by: As directed    Drink plenty of fluids.  Prune juice may be helpful.  You may use a stool softener, such as Colace (over the counter)  100 mg twice a day.  Use MiraLax (over the counter) for constipation as needed.   Constipation Prevention   Complete by: As directed    Drink plenty of fluids.  Prune juice may be helpful.  You may use a stool softener, such as Colace (over the counter) 100 mg twice a day.  Use MiraLax (over the counter) for constipation as needed.   Diet - low sodium heart healthy   Complete by: As directed    Diet - low sodium heart healthy   Complete by: As directed     Discharge instructions   Complete by: As directed    See Dr. Veverly Fells' discharge instructions   Driving restrictions   Complete by: As directed    No driving   Increase activity slowly as tolerated   Complete by: As directed    Increase activity slowly as tolerated   Complete by: As directed    Lifting restrictions   Complete by: As directed    No lifting   Post-operative opioid taper instructions:   Complete by: As directed    POST-OPERATIVE OPIOID TAPER INSTRUCTIONS: It is important to wean off of your opioid medication as soon as possible. If you do not need pain medication after your surgery it is ok to stop day one. Opioids include: Codeine, Hydrocodone(Norco, Vicodin), Oxycodone(Percocet, oxycontin) and hydromorphone amongst others.  Long term and even short term use of opiods can cause: Increased pain response Dependence Constipation Depression Respiratory depression And more.  Withdrawal symptoms can include Flu like symptoms Nausea, vomiting And more Techniques to manage these symptoms Hydrate well Eat regular healthy meals Stay active Use relaxation techniques(deep breathing, meditating, yoga) Do Not substitute Alcohol to help with tapering If you have been on opioids for less than two weeks and do not have pain than it is ok to stop all together.  Plan to wean off of opioids This plan should start within one week post op of your joint replacement. Maintain the same interval or time between taking each dose and first decrease the dose.  Cut the total daily intake of opioids by one tablet each day Next start to increase the time between doses. The last dose that should be eliminated is the evening dose.      Post-operative opioid taper instructions:   Complete by: As directed    POST-OPERATIVE OPIOID TAPER INSTRUCTIONS: It is important to wean off of your opioid medication as soon as possible. If you do not need pain medication after your surgery it is ok to  stop day one. Opioids include: Codeine, Hydrocodone(Norco, Vicodin), Oxycodone(Percocet, oxycontin) and hydromorphone amongst others.  Long term and even short term use of opiods can cause: Increased pain response Dependence Constipation Depression Respiratory depression And more.  Withdrawal symptoms can include Flu like symptoms Nausea, vomiting And more Techniques to manage these symptoms Hydrate well Eat regular healthy meals Stay active Use relaxation techniques(deep breathing, meditating, yoga) Do Not substitute Alcohol to help with tapering If you have been on opioids for less than two weeks and do not have pain than it is ok to stop all together.  Plan to wean off of opioids This plan should start within one week post op of your joint replacement. Maintain the same interval or time between taking each dose and first decrease the dose.  Cut the total daily intake of opioids by one tablet each day Next start to increase the time between doses. The last dose that should be eliminated is  the evening dose.          Allergies as of 06/25/2022   No Known Allergies      Medication List     TAKE these medications    amLODipine 5 MG tablet Commonly known as: NORVASC Take 5 mg by mouth daily.   aspirin EC 325 MG tablet Take 325 mg by mouth daily.   atorvastatin 40 MG tablet Commonly known as: LIPITOR Take 40 mg by mouth at bedtime.   b complex vitamins capsule Take 1 capsule by mouth daily. Folic acid Vitamin C   carboxymethylcellulose 0.5 % Soln Commonly known as: REFRESH PLUS Place 1 drop into both eyes 3 (three) times daily as needed (dry eyes).   cholecalciferol 25 MCG (1000 UNIT) tablet Commonly known as: VITAMIN D3 Take 1,000 Units by mouth daily.   diphenhydramine-acetaminophen 25-500 MG Tabs tablet Commonly known as: TYLENOL PM Take 1 tablet by mouth at bedtime.   docusate sodium 100 MG capsule Commonly known as: COLACE Take 1 capsule (100  mg total) by mouth 2 (two) times daily.   DULoxetine 60 MG capsule Commonly known as: CYMBALTA Take 60 mg by mouth daily.   hydrALAZINE 25 MG tablet Commonly known as: APRESOLINE Take 25 mg by mouth 3 (three) times daily.   HYDROcodone-acetaminophen 5-325 MG tablet Commonly known as: Norco Take 0.5 tablets by mouth every 6 (six) hours as needed for moderate pain or severe pain.   losartan 100 MG tablet Commonly known as: COZAAR Take 100 mg by mouth daily.   multivitamin with minerals Tabs tablet Take 1 tablet by mouth daily. Woman's   omeprazole 20 MG capsule Commonly known as: PRILOSEC Take 20 mg by mouth every morning.   ondansetron 4 MG tablet Commonly known as: ZOFRAN Take 1 tablet (4 mg total) by mouth every 6 (six) hours as needed for nausea.   polyethylene glycol 17 g packet Commonly known as: MiraLax Take 17 g by mouth daily as needed for moderate constipation or severe constipation.   Salonpas 3.08-18-08 % Ptch Generic drug: Camphor-Menthol-Methyl Sal Apply 1 patch topically 2 (two) times daily.   trolamine salicylate 10 % cream Commonly known as: ASPERCREME Apply 1 Application topically as needed (Shoulder pain).          Signed: Ventura Bruns 06/25/2022, 10:13 AM  [facility] is now [facility] [address], [place], [place], [place] [address] Phone: [place]

## 2022-06-25 NOTE — Plan of Care (Signed)
  Problem: Pain Management: Goal: Pain level will decrease with appropriate interventions Outcome: Progressing   Problem: Nutrition: Goal: Adequate nutrition will be maintained Outcome: Progressing   

## 2022-06-26 DIAGNOSIS — Z96612 Presence of left artificial shoulder joint: Secondary | ICD-10-CM | POA: Diagnosis not present

## 2022-06-26 DIAGNOSIS — Z8673 Personal history of transient ischemic attack (TIA), and cerebral infarction without residual deficits: Secondary | ICD-10-CM | POA: Diagnosis not present

## 2022-06-26 DIAGNOSIS — I10 Essential (primary) hypertension: Secondary | ICD-10-CM | POA: Diagnosis not present

## 2022-06-26 DIAGNOSIS — M75101 Unspecified rotator cuff tear or rupture of right shoulder, not specified as traumatic: Secondary | ICD-10-CM | POA: Diagnosis not present

## 2022-06-26 DIAGNOSIS — Z79899 Other long term (current) drug therapy: Secondary | ICD-10-CM | POA: Diagnosis not present

## 2022-06-26 DIAGNOSIS — Z96653 Presence of artificial knee joint, bilateral: Secondary | ICD-10-CM | POA: Diagnosis not present

## 2022-06-26 DIAGNOSIS — Z7982 Long term (current) use of aspirin: Secondary | ICD-10-CM | POA: Diagnosis not present

## 2022-06-26 DIAGNOSIS — I480 Paroxysmal atrial fibrillation: Secondary | ICD-10-CM | POA: Diagnosis not present

## 2022-06-26 DIAGNOSIS — Z85828 Personal history of other malignant neoplasm of skin: Secondary | ICD-10-CM | POA: Diagnosis not present

## 2022-06-26 NOTE — Progress Notes (Signed)
Discharge package printed and instructions given to daughter. Verbalizes understanding.  

## 2022-06-26 NOTE — Discharge Summary (Signed)
In most cases prophylactic antibiotics for Dental procdeures after total joint surgery are not necessary.  Exceptions are as follows:  1. History of prior total joint infection  2. Severely immunocompromised (Organ Transplant, cancer chemotherapy, Rheumatoid biologic meds such as [place])  3. Poorly controlled diabetes (A1C &gt; 8.0, blood glucose over 200)  If you have one of these conditions, contact your surgeon for an antibiotic prescription, prior to your dental procedure. Orthopedic Discharge Summary        Physician Discharge Summary  Patient ID: [name] MRN: [id number] DOB/AGE: [birth year] 88 y.o.  Admit date: 06/22/2022 Discharge date: 06/26/2022   Procedures:  Procedure(s) (LRB): REVERSE total SHOULDER ARTHROPLASTY (Right)  Attending Physician:  Dr. Esmond Plants  Admission Diagnoses:   right shoulder cuff arthropathy  Discharge Diagnoses:  right shoulder cuff arthropathy   Past Medical History:  Diagnosis Date   Arthritis    Cancer [facility])    skin cancer moles removed 0254   Complication of anesthesia    hyper ventilating and spinal did not work june 05-2102   GERD (gastroesophageal reflux disease)    Heart murmur    mild, no cardiologist   Hypertension    PAF (paroxysmal atrial fibrillation) ([place])    Stroke ([place])    right inferior occipital lobe infarct treated with Aspirin    PCP: Lyman Bishop, DO   Discharged Condition: good  Hospital Course:  Patient underwent the above stated procedure on 06/22/2022. Patient tolerated the procedure well and brought to the recovery room in good condition and subsequently to the floor. Patient had an uncomplicated hospital course and was stable for discharge.   Disposition: Discharge disposition: 01-Home or Self Care      with follow up in 2 weeks    Contact information for follow-up providers     Netta Cedars, MD. Schedule an appointment as soon as possible for a visit in 2 week(s).    Specialty: Orthopedic Surgery Why: call for appt [phone] Contact information: [address] [place] [address] [phone]              Contact information for after-discharge care     [facility] .   Service: Skilled Nursing Contact information: [address] [place] [phone]                     Dental Antibiotics:  In most cases prophylactic antibiotics for Dental procdeures after total joint surgery are not necessary.  Exceptions are as follows:  1. History of prior total joint infection  2. Severely immunocompromised (Organ Transplant, cancer chemotherapy, Rheumatoid biologic meds such as [place])  3. Poorly controlled diabetes (A1C &gt; 8.0, blood glucose over 200)  If you have one of these conditions, contact your surgeon for an antibiotic prescription, prior to your dental procedure.  Discharge Instructions     Call MD / Call 911   Complete by: As directed    If you experience chest pain or shortness of breath, CALL 911 and be transported to the hospital emergency room.  If you develope a fever above 101 F, pus (white drainage) or increased drainage or redness at the wound, or calf pain, call your surgeon's office.   Call MD / Call 911   Complete by: As directed    If you experience chest pain or shortness of breath, CALL 911 and be transported to the hospital  emergency room.  If you develope a fever above 101 F, pus (white drainage) or increased drainage or redness at the wound, or calf pain, call your surgeon's office.   Call MD / Call 911   Complete by: As directed    If you experience chest pain or shortness of breath, CALL 911 and be transported to the hospital emergency room.  If you develope a fever above 101 F, pus (white drainage) or increased drainage or redness at the wound, or calf pain, call your surgeon's office.   Constipation  Prevention   Complete by: As directed    Drink plenty of fluids.  Prune juice may be helpful.  You may use a stool softener, such as Colace (over the counter) 100 mg twice a day.  Use MiraLax (over the counter) for constipation as needed.   Constipation Prevention   Complete by: As directed    Drink plenty of fluids.  Prune juice may be helpful.  You may use a stool softener, such as Colace (over the counter) 100 mg twice a day.  Use MiraLax (over the counter) for constipation as needed.   Constipation Prevention   Complete by: As directed    Drink plenty of fluids.  Prune juice may be helpful.  You may use a stool softener, such as Colace (over the counter) 100 mg twice a day.  Use MiraLax (over the counter) for constipation as needed.   Diet - low sodium heart healthy   Complete by: As directed    Diet - low sodium heart healthy   Complete by: As directed    Diet - low sodium heart healthy   Complete by: As directed    Discharge instructions   Complete by: As directed    See Dr. Veverly Fells' discharge instructions   Driving restrictions   Complete by: As directed    No driving   Increase activity slowly as tolerated   Complete by: As directed    Increase activity slowly as tolerated   Complete by: As directed    Increase activity slowly as tolerated   Complete by: As directed    Lifting restrictions   Complete by: As directed    No lifting   Post-operative opioid taper instructions:   Complete by: As directed    POST-OPERATIVE OPIOID TAPER INSTRUCTIONS: It is important to wean off of your opioid medication as soon as possible. If you do not need pain medication after your surgery it is ok to stop day one. Opioids include: Codeine, Hydrocodone(Norco, Vicodin), Oxycodone(Percocet, oxycontin) and hydromorphone amongst others.  Long term and even short term use of opiods can cause: Increased pain response Dependence Constipation Depression Respiratory depression And more.   Withdrawal symptoms can include Flu like symptoms Nausea, vomiting And more Techniques to manage these symptoms Hydrate well Eat regular healthy meals Stay active Use relaxation techniques(deep breathing, meditating, yoga) Do Not substitute Alcohol to help with tapering If you have been on opioids for less than two weeks and do not have pain than it is ok to stop all together.  Plan to wean off of opioids This plan should start within one week post op of your joint replacement. Maintain the same interval or time between taking each dose and first decrease the dose.  Cut the total daily intake of opioids by one tablet each day Next start to increase the time between doses. The last dose that should be eliminated is the evening dose.      Post-operative  opioid taper instructions:   Complete by: As directed    POST-OPERATIVE OPIOID TAPER INSTRUCTIONS: It is important to wean off of your opioid medication as soon as possible. If you do not need pain medication after your surgery it is ok to stop day one. Opioids include: Codeine, Hydrocodone(Norco, Vicodin), Oxycodone(Percocet, oxycontin) and hydromorphone amongst others.  Long term and even short term use of opiods can cause: Increased pain response Dependence Constipation Depression Respiratory depression And more.  Withdrawal symptoms can include Flu like symptoms Nausea, vomiting And more Techniques to manage these symptoms Hydrate well Eat regular healthy meals Stay active Use relaxation techniques(deep breathing, meditating, yoga) Do Not substitute Alcohol to help with tapering If you have been on opioids for less than two weeks and do not have pain than it is ok to stop all together.  Plan to wean off of opioids This plan should start within one week post op of your joint replacement. Maintain the same interval or time between taking each dose and first decrease the dose.  Cut the total daily intake of opioids by  one tablet each day Next start to increase the time between doses. The last dose that should be eliminated is the evening dose.      Post-operative opioid taper instructions:   Complete by: As directed    POST-OPERATIVE OPIOID TAPER INSTRUCTIONS: It is important to wean off of your opioid medication as soon as possible. If you do not need pain medication after your surgery it is ok to stop day one. Opioids include: Codeine, Hydrocodone(Norco, Vicodin), Oxycodone(Percocet, oxycontin) and hydromorphone amongst others.  Long term and even short term use of opiods can cause: Increased pain response Dependence Constipation Depression Respiratory depression And more.  Withdrawal symptoms can include Flu like symptoms Nausea, vomiting And more Techniques to manage these symptoms Hydrate well Eat regular healthy meals Stay active Use relaxation techniques(deep breathing, meditating, yoga) Do Not substitute Alcohol to help with tapering If you have been on opioids for less than two weeks and do not have pain than it is ok to stop all together.  Plan to wean off of opioids This plan should start within one week post op of your joint replacement. Maintain the same interval or time between taking each dose and first decrease the dose.  Cut the total daily intake of opioids by one tablet each day Next start to increase the time between doses. The last dose that should be eliminated is the evening dose.          Allergies as of 06/26/2022   No Known Allergies      Medication List     TAKE these medications    amLODipine 5 MG tablet Commonly known as: NORVASC Take 5 mg by mouth daily.   aspirin EC 325 MG tablet Take 325 mg by mouth daily.   atorvastatin 40 MG tablet Commonly known as: LIPITOR Take 40 mg by mouth at bedtime.   b complex vitamins capsule Take 1 capsule by mouth daily. Folic acid Vitamin C   carboxymethylcellulose 0.5 % Soln Commonly known as: REFRESH  PLUS Place 1 drop into both eyes 3 (three) times daily as needed (dry eyes).   cholecalciferol 25 MCG (1000 UNIT) tablet Commonly known as: VITAMIN D3 Take 1,000 Units by mouth daily.   diphenhydramine-acetaminophen 25-500 MG Tabs tablet Commonly known as: TYLENOL PM Take 1 tablet by mouth at bedtime.   docusate sodium 100 MG capsule Commonly known as: COLACE Take 1 capsule (  100 mg total) by mouth 2 (two) times daily.   DULoxetine 60 MG capsule Commonly known as: CYMBALTA Take 60 mg by mouth daily.   hydrALAZINE 25 MG tablet Commonly known as: APRESOLINE Take 25 mg by mouth 3 (three) times daily.   HYDROcodone-acetaminophen 5-325 MG tablet Commonly known as: Norco Take 0.5 tablets by mouth every 6 (six) hours as needed for moderate pain or severe pain.   losartan 100 MG tablet Commonly known as: COZAAR Take 100 mg by mouth daily.   multivitamin with minerals Tabs tablet Take 1 tablet by mouth daily. Woman's   omeprazole 20 MG capsule Commonly known as: PRILOSEC Take 20 mg by mouth every morning.   ondansetron 4 MG tablet Commonly known as: ZOFRAN Take 1 tablet (4 mg total) by mouth every 6 (six) hours as needed for nausea.   polyethylene glycol 17 g packet Commonly known as: MiraLax Take 17 g by mouth daily as needed for moderate constipation or severe constipation.   Salonpas 3.08-18-08 % Ptch Generic drug: Camphor-Menthol-Methyl Sal Apply 1 patch topically 2 (two) times daily.   trolamine salicylate 10 % cream Commonly known as: ASPERCREME Apply 1 Application topically as needed (Shoulder pain).          Signed: Ventura Bruns 06/26/2022, 1:56 PM  [facility] is now [facility] [address], [place], [place], [place] [address] Phone: [place]

## 2022-06-26 NOTE — Plan of Care (Signed)
  Problem: Education: Goal: Knowledge of General Education information will improve Description Including pain rating scale, medication(s)/side effects and non-pharmacologic comfort measures Outcome: Progressing   

## 2022-06-26 NOTE — Plan of Care (Signed)
  Problem: Activity: Goal: Ability to tolerate increased activity will improve Outcome: Progressing   Problem: Pain Management: Goal: Pain level will decrease with appropriate interventions Outcome: Progressing

## 2022-06-26 NOTE — Progress Notes (Signed)
Occupational Therapy Treatment Patient Details Name: [name] MRN: [id number] DOB: 1933-05-02 Today's Date: 06/26/2022   History of present illness Patient is a 88 year old female who presented with a right shoulder rotator cuff tear arthropathy. Patient underwent a right reverse shoulder replacement on 11/10. PMH::L and R TKA, L shoulder replacement, stroke, GERD, CA.   OT comments  Patient was able to don button up shirt with min A with A for buttons and maintaining RUE at side with consistent cues verbal and tactile. Patient was noted to have continued confusion over shoulder restrictions and needs continued supervision to maintain restrictions in next level of care.patient was educated multiple times on need for 24/7 supervision in next level of care to provide cues to maintain shoulder restrictions.  Patient would continue to benefit from skilled OT services at this time while admitted and after d/c to address noted deficits in order to improve overall safety and independence in ADLs.     Recommendations for follow up therapy are one component of a multi-disciplinary discharge planning process, led by the attending physician.  Recommendations may be updated based on patient status, additional functional criteria and insurance authorization.    Follow Up Recommendations  Follow physician's recommendations for discharge plan and follow up therapies     Assistance Recommended at Discharge Frequent or constant Supervision/Assistance  Patient can return home with the following  A little help with bathing/dressing/bathroom;A little help with walking and/or transfers;Assistance with cooking/housework;Direct supervision/assist for medications management;Direct supervision/assist for financial management;Help with stairs or ramp for entrance;Assist for transportation   Equipment Recommendations       Recommendations for Other Services      Precautions / Restrictions  Precautions Precautions: Shoulder Type of Shoulder Precautions: NO ROM shoulder, Ok for AROM hand wrist and elbow Shoulder Interventions: Shoulder sling/immobilizer;Off for dressing/bathing/exercises;At all times Precaution Booklet Issued: Yes (comment) Restrictions Weight Bearing Restrictions: Yes RUE Weight Bearing: Non weight bearing       Mobility Bed Mobility               General bed mobility comments: patient was up in recliner and returned to the same    Transfers                         Balance Overall balance assessment: Needs assistance         Standing balance support: No upper extremity supported, During functional activity Standing balance-Leahy Scale: Fair                             ADL either performed or assessed with clinical judgement   ADL Overall ADL's : Needs assistance/impaired                 Upper Body Dressing : Minimal assistance;Sitting Upper Body Dressing Details (indicate cue type and reason): with buttons on button up shirt secondary to size with cues for completion of task while mainaining ROM restrictions. Lower Body Dressing: Sit to/from stand;Minimal assistance Lower Body Dressing Details (indicate cue type and reason): patient needed min A to pull up pants on R side with increased time while standing. patient was educated on imporance of keeping gripper socks off prior to donning pants. patient verbalized understanding.               General ADL Comments: patient was educated consistently on need for 24/7 supervision at home and that she  could transition home with family and friend support at current level. patient reported she could go home alone after education. patient was again educated on how she is unable to maintain shoulder restrictions at this time. patient verbalized understanding. patient needed consistent education during session to maintain shoulder and WB restrictions.     Extremity/Trunk Assessment              Vision       Perception     Praxis      Cognition Arousal/Alertness: Awake/alert Behavior During Therapy: WFL for tasks assessed/performed                                   General Comments: patient was noted to repeat herself multiple times during session with increased confusion reguarding d/c plan and continued inability to maintain shoulder restrictions without consistent cues during all tasks.        Exercises      Shoulder Instructions       General Comments      Pertinent Vitals/ Pain       Pain Assessment Pain Assessment: No/denies pain  Home Living                                          Prior Functioning/Environment              Frequency  Min 2X/week        Progress Toward Goals  OT Goals(current goals can now be found in the care plan section)  Progress towards OT goals: Progressing toward goals     Plan Discharge plan remains appropriate    Co-evaluation                 AM-PAC OT "6 Clicks" Daily Activity     Outcome Measure   Help from another person eating meals?: None Help from another person taking care of personal grooming?: A Little Help from another person toileting, which includes using toliet, bedpan, or urinal?: A Little Help from another person bathing (including washing, rinsing, drying)?: A Little Help from another person to put on and taking off regular upper body clothing?: A Little Help from another person to put on and taking off regular lower body clothing?: A Little 6 Click Score: 19    End of Session Equipment Utilized During Treatment: Other (comment) (sling)  OT Visit Diagnosis: Unsteadiness on feet (R26.81);Other abnormalities of gait and mobility (R26.89)   Activity Tolerance Patient tolerated treatment well   Patient Left with call bell/phone within reach;in chair   Nurse Communication Other (comment) (d/c  recommendations)        Time: 5974-1638 OT Time Calculation (min): 39 min  Charges: OT General Charges $OT Visit: 1 Visit OT Treatments $Self Care/Home Management : 38-52 mins  Rennie Plowman, MS Acute Rehabilitation Department Office# [phone]   Marcellina Millin 06/26/2022, 8:57 AM

## 2022-06-26 NOTE — TOC Transition Note (Signed)
Transition of Care (TOC) - CM/SW Discharge Note   Patient Details  Name: [name] MRN: [id number] Date of Birth: 06/05/1933  Transition of Care (TOC) CM/SW Contact:  HOYLE, LUCY, LCSW Phone Number: 06/26/2022, 1:34 PM   Clinical Narrative:     Met with pt and daughter-in-law today and both now requesting change of dc plan to home with HHPT.  MD/PT/ [facility]o [facility] CM all aware and agreeable.  HHPT being arranged with [facility].  Pt to have someone stay with her for the first few days to ensure safety at home.  No further TOC needs.  Final next level of care: Home w Home Health Services Barriers to Discharge: Barriers Resolved   Patient Goals and CMS Choice Patient states their goals for this hospitalization and ongoing recovery are:: return home following SNF rehab      Discharge Placement                       Discharge Plan and Services In-house Referral: Clinical Social Work   Post Acute Care Choice: Skilled Nursing Facility          DME Arranged: N/A DME Agency: NA       HH Arranged: PT HH Agency: [facility]     Representative spoke with at HH Agency: being arranged by [facility] CM, Brooke  Social Determinants of Health (SDOH) Interventions     Readmission Risk Interventions     No data to display             

## 2022-06-26 NOTE — Progress Notes (Signed)
   Subjective: 4 Days Post-Op Procedure(s) (LRB): REVERSE total SHOULDER ARTHROPLASTY (Right)  Pt improved today and states she has been more mobile  Would like to discuss options to go home Denies any new symptoms or issues Patient reports pain as mild.  Objective:   VITALS:   Vitals:   06/25/22 2238 06/26/22 0557  BP: 135/67 (!) 127/59  Pulse: 85 74  Resp: 17 17  Temp: 97.8 F (36.6 C) 97.8 F (36.6 C)  SpO2: 96% 96%    Right shoulder: incision healing well Nv intact distally No rashes or edema  Sling in place   LABS No results for input(s): "HGB", "HCT", "WBC", "PLT" in the last 72 hours.  No results for input(s): "NA", "K", "BUN", "CREATININE", "GLUCOSE" in the last 72 hours.   Assessment/Plan: 4 Days Post-Op Procedure(s) (LRB): REVERSE total SHOULDER ARTHROPLASTY (Right) Patient has improved with mobility and balance She would like to go home instead of SNF Will monitor her progress today    [name] PA-C, [place] is now [facility] [facility] [address] [place], [place], [place] [address] Phone: [phone] [email or web address] Facebook  [employer]

## 2022-06-26 NOTE — Progress Notes (Signed)
   Subjective: 4 Days Post-Op Procedure(s) (LRB): REVERSE total SHOULDER ARTHROPLASTY (Right)  Minimal pain today  Ready to d/c home with home health Patient reports pain as mild.  Objective:   VITALS:   Vitals:   06/26/22 1255 06/26/22 1335  BP: (!) 153/74 (!) 129/56  Pulse: 94 88  Resp: 17 17  Temp: 98.2 F (36.8 C) 98.3 F (36.8 C)  SpO2: 98% 98%    Well healing incision to anterior right shoulder Nv intact distally No rashes or edema  LABS No results for input(s): "HGB", "HCT", "WBC", "PLT" in the last 72 hours.  No results for input(s): "NA", "K", "BUN", "CREATININE", "GLUCOSE" in the last 72 hours.   Assessment/Plan: 4 Days Post-Op Procedure(s) (LRB): REVERSE total SHOULDER ARTHROPLASTY (Right) Plan to d/c home today  Home health setup with [facility] F/u in the office in 2 weeks     [place] PA-C, [place] is now [employer] [facility] [address], [place], [place], [place] [address] Phone: [phone] [email or web address] Facebook  [employer]

## 2022-06-26 NOTE — Progress Notes (Signed)
Physical Therapy Treatment Patient Details Name: [name] MRN: [id number] DOB: [birth year] Today's Date: 06/26/2022   History of Present Illness Patient is a 88 year old female who presented with a right shoulder rotator cuff tear arthropathy. Patient underwent a right reverse shoulder replacement on 11/10. PMH::L and R TKA, L shoulder replacement, stroke, GERD, CA.    PT Comments    Pt is cooperative, pleasant, willing to work with PT. Reports "they are trying to decide what to do with me". Pt amb incr distance with good activity tolerance however continues to demonstrate higher level balance deficits placing her at risk for falls. Will need 24hr supervision/assist at d/c.   Recommendations for follow up therapy are one component of a multi-disciplinary discharge planning process, led by the attending physician.  Recommendations may be updated based on patient status, additional functional criteria and insurance authorization.  Follow Up Recommendations  Follow physician's recommendations for discharge plan and follow up therapies (SNF, family requests)     Assistance Recommended at Discharge Frequent or constant Supervision/Assistance  Patient can return home with the following A little help with walking and/or transfers;A little help with bathing/dressing/bathroom;Assistance with cooking/housework;Assist for transportation;Help with stairs or ramp for entrance   Equipment Recommendations  Other (comment);[facility] or defer to SNF)    Recommendations for Other Services       Precautions / Restrictions Precautions Precautions: Shoulder Type of Shoulder Precautions: NO ROM shoulder, Ok for AROM hand wrist and elbow Shoulder Interventions: Shoulder sling/immobilizer;Off for dressing/bathing/exercises;At all times Precaution Booklet Issued: Yes (comment) Restrictions Weight Bearing Restrictions: Yes RUE Weight Bearing: Non weight bearing     Mobility  Bed Mobility                General bed mobility comments: patient was up in recliner and returned to the same    Transfers Overall transfer level: Needs assistance Equipment used: None Transfers: Sit to/from Stand Sit to Stand: Supervision           General transfer comment: for safety    Ambulation/Gait Ambulation/Gait assistance: Min guard, Min assist Gait Distance (Feet):  (hallway amb) Assistive device: None, Straight cane Gait Pattern/deviations: Step-through pattern, Decreased stride length       General Gait Details: amb with and without cane. LOB x1 with min assist to recover. improved stride length and stability with cane   Stairs             Wheelchair Mobility    Modified Rankin (Stroke Patients Only)       Balance             Standing balance-Leahy Scale: Fair               High level balance activites: Backward walking, Direction changes, Turns, Head turns High Level Balance Comments: min assist to min/guard for higher level balance            Cognition Arousal/Alertness: Awake/alert Behavior During Therapy: WFL for tasks assessed/performed Overall Cognitive Status: Within Functional Limits for tasks assessed                                          Exercises General Exercises - Lower Extremity Long Arc Quad: AROM, Both, 5 reps Toe Raises: AROM, Both, 5 reps    General Comments        Pertinent Vitals/Pain Pain Assessment Pain Assessment: No/denies  pain    Home Living                          Prior Function            PT Goals (current goals can now be found in the care plan section) Acute Rehab PT Goals Patient Stated Goal: my family wants me to go tho the nursing home PT Goal Formulation: With patient Time For Goal Achievement: 07/08/22 Potential to Achieve Goals: Good Progress towards PT goals: Progressing toward goals    Frequency    Min 3X/week      PT Plan Current plan remains  appropriate    Co-evaluation              AM-PAC PT "6 Clicks" Mobility   Outcome Measure  Help needed turning from your back to your side while in a flat bed without using bedrails?: A Little Help needed moving from lying on your back to sitting on the side of a flat bed without using bedrails?: A Little Help needed moving to and from a bed to a chair (including a wheelchair)?: A Little Help needed standing up from a chair using your arms (e.g., wheelchair or bedside chair)?: None Help needed to walk in hospital room?: A Little Help needed climbing 3-5 steps with a railing? : A Little 6 Click Score: 19    End of Session Equipment Utilized During Treatment: Gait belt;Other (comment) (shoulder sling) Activity Tolerance: Patient tolerated treatment well Patient left: in chair;with call bell/phone within reach (no alarm on arrival) Nurse Communication: Mobility status PT Visit Diagnosis: Other abnormalities of gait and mobility (R26.89);Difficulty in walking, not elsewhere classified (R26.2)     Time: 1126-1140 PT Time Calculation (min) (ACUTE ONLY): 14 min  Charges:  $Gait Training: 8-22 mins                     Baxter Flattery, PT  Acute Rehab Dept [facility]) [phone]  [facility] Weekend Pager [facility] only)  [phone]  06/26/2022    [facility] 06/26/2022, 11:46 AM

## 2022-06-28 DIAGNOSIS — E871 Hypo-osmolality and hyponatremia: Secondary | ICD-10-CM | POA: Diagnosis not present

## 2022-06-28 DIAGNOSIS — I480 Paroxysmal atrial fibrillation: Secondary | ICD-10-CM | POA: Diagnosis not present

## 2022-06-28 DIAGNOSIS — Z85828 Personal history of other malignant neoplasm of skin: Secondary | ICD-10-CM | POA: Diagnosis not present

## 2022-06-28 DIAGNOSIS — M1990 Unspecified osteoarthritis, unspecified site: Secondary | ICD-10-CM | POA: Diagnosis not present

## 2022-06-28 DIAGNOSIS — I10 Essential (primary) hypertension: Secondary | ICD-10-CM | POA: Diagnosis not present

## 2022-06-28 DIAGNOSIS — Z471 Aftercare following joint replacement surgery: Secondary | ICD-10-CM | POA: Diagnosis not present

## 2022-06-28 DIAGNOSIS — Z96611 Presence of right artificial shoulder joint: Secondary | ICD-10-CM | POA: Diagnosis not present

## 2022-06-28 DIAGNOSIS — K219 Gastro-esophageal reflux disease without esophagitis: Secondary | ICD-10-CM | POA: Diagnosis not present

## 2022-06-28 DIAGNOSIS — Z9181 History of falling: Secondary | ICD-10-CM | POA: Diagnosis not present

## 2022-06-28 DIAGNOSIS — Z96653 Presence of artificial knee joint, bilateral: Secondary | ICD-10-CM | POA: Diagnosis not present

## 2022-06-28 DIAGNOSIS — Z7982 Long term (current) use of aspirin: Secondary | ICD-10-CM | POA: Diagnosis not present

## 2022-06-28 DIAGNOSIS — Z8673 Personal history of transient ischemic attack (TIA), and cerebral infarction without residual deficits: Secondary | ICD-10-CM | POA: Diagnosis not present

## 2022-06-28 DIAGNOSIS — E669 Obesity, unspecified: Secondary | ICD-10-CM | POA: Diagnosis not present

## 2022-06-28 DIAGNOSIS — Z6827 Body mass index (BMI) 27.0-27.9, adult: Secondary | ICD-10-CM | POA: Diagnosis not present

## 2022-07-03 DIAGNOSIS — Z4789 Encounter for other orthopedic aftercare: Secondary | ICD-10-CM | POA: Diagnosis not present

## 2022-07-04 DIAGNOSIS — Z96611 Presence of right artificial shoulder joint: Secondary | ICD-10-CM | POA: Diagnosis not present

## 2022-07-04 DIAGNOSIS — Z8673 Personal history of transient ischemic attack (TIA), and cerebral infarction without residual deficits: Secondary | ICD-10-CM | POA: Diagnosis not present

## 2022-07-04 DIAGNOSIS — E871 Hypo-osmolality and hyponatremia: Secondary | ICD-10-CM | POA: Diagnosis not present

## 2022-07-04 DIAGNOSIS — Z6827 Body mass index (BMI) 27.0-27.9, adult: Secondary | ICD-10-CM | POA: Diagnosis not present

## 2022-07-04 DIAGNOSIS — Z7982 Long term (current) use of aspirin: Secondary | ICD-10-CM | POA: Diagnosis not present

## 2022-07-04 DIAGNOSIS — Z471 Aftercare following joint replacement surgery: Secondary | ICD-10-CM | POA: Diagnosis not present

## 2022-07-04 DIAGNOSIS — Z9181 History of falling: Secondary | ICD-10-CM | POA: Diagnosis not present

## 2022-07-04 DIAGNOSIS — Z85828 Personal history of other malignant neoplasm of skin: Secondary | ICD-10-CM | POA: Diagnosis not present

## 2022-07-04 DIAGNOSIS — I480 Paroxysmal atrial fibrillation: Secondary | ICD-10-CM | POA: Diagnosis not present

## 2022-07-04 DIAGNOSIS — Z96653 Presence of artificial knee joint, bilateral: Secondary | ICD-10-CM | POA: Diagnosis not present

## 2022-07-04 DIAGNOSIS — K219 Gastro-esophageal reflux disease without esophagitis: Secondary | ICD-10-CM | POA: Diagnosis not present

## 2022-07-04 DIAGNOSIS — M1990 Unspecified osteoarthritis, unspecified site: Secondary | ICD-10-CM | POA: Diagnosis not present

## 2022-07-04 DIAGNOSIS — E669 Obesity, unspecified: Secondary | ICD-10-CM | POA: Diagnosis not present

## 2022-07-04 DIAGNOSIS — I10 Essential (primary) hypertension: Secondary | ICD-10-CM | POA: Diagnosis not present

## 2022-07-06 DIAGNOSIS — E669 Obesity, unspecified: Secondary | ICD-10-CM | POA: Diagnosis not present

## 2022-07-06 DIAGNOSIS — I10 Essential (primary) hypertension: Secondary | ICD-10-CM | POA: Diagnosis not present

## 2022-07-06 DIAGNOSIS — Z9181 History of falling: Secondary | ICD-10-CM | POA: Diagnosis not present

## 2022-07-06 DIAGNOSIS — K219 Gastro-esophageal reflux disease without esophagitis: Secondary | ICD-10-CM | POA: Diagnosis not present

## 2022-07-06 DIAGNOSIS — Z6827 Body mass index (BMI) 27.0-27.9, adult: Secondary | ICD-10-CM | POA: Diagnosis not present

## 2022-07-06 DIAGNOSIS — Z7982 Long term (current) use of aspirin: Secondary | ICD-10-CM | POA: Diagnosis not present

## 2022-07-06 DIAGNOSIS — Z85828 Personal history of other malignant neoplasm of skin: Secondary | ICD-10-CM | POA: Diagnosis not present

## 2022-07-06 DIAGNOSIS — Z471 Aftercare following joint replacement surgery: Secondary | ICD-10-CM | POA: Diagnosis not present

## 2022-07-06 DIAGNOSIS — I480 Paroxysmal atrial fibrillation: Secondary | ICD-10-CM | POA: Diagnosis not present

## 2022-07-06 DIAGNOSIS — Z8673 Personal history of transient ischemic attack (TIA), and cerebral infarction without residual deficits: Secondary | ICD-10-CM | POA: Diagnosis not present

## 2022-07-06 DIAGNOSIS — M1990 Unspecified osteoarthritis, unspecified site: Secondary | ICD-10-CM | POA: Diagnosis not present

## 2022-07-06 DIAGNOSIS — E871 Hypo-osmolality and hyponatremia: Secondary | ICD-10-CM | POA: Diagnosis not present

## 2022-07-06 DIAGNOSIS — Z96611 Presence of right artificial shoulder joint: Secondary | ICD-10-CM | POA: Diagnosis not present

## 2022-07-06 DIAGNOSIS — Z96653 Presence of artificial knee joint, bilateral: Secondary | ICD-10-CM | POA: Diagnosis not present

## 2022-07-10 DIAGNOSIS — Z85828 Personal history of other malignant neoplasm of skin: Secondary | ICD-10-CM | POA: Diagnosis not present

## 2022-07-10 DIAGNOSIS — Z96653 Presence of artificial knee joint, bilateral: Secondary | ICD-10-CM | POA: Diagnosis not present

## 2022-07-10 DIAGNOSIS — Z471 Aftercare following joint replacement surgery: Secondary | ICD-10-CM | POA: Diagnosis not present

## 2022-07-10 DIAGNOSIS — Z6827 Body mass index (BMI) 27.0-27.9, adult: Secondary | ICD-10-CM | POA: Diagnosis not present

## 2022-07-10 DIAGNOSIS — Z96611 Presence of right artificial shoulder joint: Secondary | ICD-10-CM | POA: Diagnosis not present

## 2022-07-10 DIAGNOSIS — Z9181 History of falling: Secondary | ICD-10-CM | POA: Diagnosis not present

## 2022-07-10 DIAGNOSIS — E669 Obesity, unspecified: Secondary | ICD-10-CM | POA: Diagnosis not present

## 2022-07-10 DIAGNOSIS — E871 Hypo-osmolality and hyponatremia: Secondary | ICD-10-CM | POA: Diagnosis not present

## 2022-07-10 DIAGNOSIS — Z8673 Personal history of transient ischemic attack (TIA), and cerebral infarction without residual deficits: Secondary | ICD-10-CM | POA: Diagnosis not present

## 2022-07-10 DIAGNOSIS — I10 Essential (primary) hypertension: Secondary | ICD-10-CM | POA: Diagnosis not present

## 2022-07-10 DIAGNOSIS — K219 Gastro-esophageal reflux disease without esophagitis: Secondary | ICD-10-CM | POA: Diagnosis not present

## 2022-07-10 DIAGNOSIS — M1990 Unspecified osteoarthritis, unspecified site: Secondary | ICD-10-CM | POA: Diagnosis not present

## 2022-07-10 DIAGNOSIS — Z7982 Long term (current) use of aspirin: Secondary | ICD-10-CM | POA: Diagnosis not present

## 2022-07-10 DIAGNOSIS — I480 Paroxysmal atrial fibrillation: Secondary | ICD-10-CM | POA: Diagnosis not present

## 2022-07-13 DIAGNOSIS — I10 Essential (primary) hypertension: Secondary | ICD-10-CM | POA: Diagnosis not present

## 2022-07-13 DIAGNOSIS — Z6827 Body mass index (BMI) 27.0-27.9, adult: Secondary | ICD-10-CM | POA: Diagnosis not present

## 2022-07-13 DIAGNOSIS — Z96611 Presence of right artificial shoulder joint: Secondary | ICD-10-CM | POA: Diagnosis not present

## 2022-07-13 DIAGNOSIS — E669 Obesity, unspecified: Secondary | ICD-10-CM | POA: Diagnosis not present

## 2022-07-13 DIAGNOSIS — Z9181 History of falling: Secondary | ICD-10-CM | POA: Diagnosis not present

## 2022-07-13 DIAGNOSIS — Z85828 Personal history of other malignant neoplasm of skin: Secondary | ICD-10-CM | POA: Diagnosis not present

## 2022-07-13 DIAGNOSIS — Z471 Aftercare following joint replacement surgery: Secondary | ICD-10-CM | POA: Diagnosis not present

## 2022-07-13 DIAGNOSIS — I480 Paroxysmal atrial fibrillation: Secondary | ICD-10-CM | POA: Diagnosis not present

## 2022-07-13 DIAGNOSIS — Z96653 Presence of artificial knee joint, bilateral: Secondary | ICD-10-CM | POA: Diagnosis not present

## 2022-07-13 DIAGNOSIS — Z7982 Long term (current) use of aspirin: Secondary | ICD-10-CM | POA: Diagnosis not present

## 2022-07-13 DIAGNOSIS — K219 Gastro-esophageal reflux disease without esophagitis: Secondary | ICD-10-CM | POA: Diagnosis not present

## 2022-07-13 DIAGNOSIS — E871 Hypo-osmolality and hyponatremia: Secondary | ICD-10-CM | POA: Diagnosis not present

## 2022-07-13 DIAGNOSIS — M1990 Unspecified osteoarthritis, unspecified site: Secondary | ICD-10-CM | POA: Diagnosis not present

## 2022-07-13 DIAGNOSIS — Z8673 Personal history of transient ischemic attack (TIA), and cerebral infarction without residual deficits: Secondary | ICD-10-CM | POA: Diagnosis not present

## 2022-07-16 DIAGNOSIS — M1990 Unspecified osteoarthritis, unspecified site: Secondary | ICD-10-CM | POA: Diagnosis not present

## 2022-07-16 DIAGNOSIS — I10 Essential (primary) hypertension: Secondary | ICD-10-CM | POA: Diagnosis not present

## 2022-07-16 DIAGNOSIS — Z6827 Body mass index (BMI) 27.0-27.9, adult: Secondary | ICD-10-CM | POA: Diagnosis not present

## 2022-07-16 DIAGNOSIS — Z96611 Presence of right artificial shoulder joint: Secondary | ICD-10-CM | POA: Diagnosis not present

## 2022-07-16 DIAGNOSIS — Z7982 Long term (current) use of aspirin: Secondary | ICD-10-CM | POA: Diagnosis not present

## 2022-07-16 DIAGNOSIS — K219 Gastro-esophageal reflux disease without esophagitis: Secondary | ICD-10-CM | POA: Diagnosis not present

## 2022-07-16 DIAGNOSIS — Z471 Aftercare following joint replacement surgery: Secondary | ICD-10-CM | POA: Diagnosis not present

## 2022-07-16 DIAGNOSIS — Z9181 History of falling: Secondary | ICD-10-CM | POA: Diagnosis not present

## 2022-07-16 DIAGNOSIS — I480 Paroxysmal atrial fibrillation: Secondary | ICD-10-CM | POA: Diagnosis not present

## 2022-07-16 DIAGNOSIS — Z85828 Personal history of other malignant neoplasm of skin: Secondary | ICD-10-CM | POA: Diagnosis not present

## 2022-07-16 DIAGNOSIS — Z8673 Personal history of transient ischemic attack (TIA), and cerebral infarction without residual deficits: Secondary | ICD-10-CM | POA: Diagnosis not present

## 2022-07-16 DIAGNOSIS — E871 Hypo-osmolality and hyponatremia: Secondary | ICD-10-CM | POA: Diagnosis not present

## 2022-07-16 DIAGNOSIS — Z96653 Presence of artificial knee joint, bilateral: Secondary | ICD-10-CM | POA: Diagnosis not present

## 2022-07-16 DIAGNOSIS — E669 Obesity, unspecified: Secondary | ICD-10-CM | POA: Diagnosis not present

## 2022-08-31 DIAGNOSIS — Z87898 Personal history of other specified conditions: Secondary | ICD-10-CM | POA: Diagnosis not present

## 2022-08-31 DIAGNOSIS — I10 Essential (primary) hypertension: Secondary | ICD-10-CM | POA: Diagnosis not present

## 2022-08-31 DIAGNOSIS — N1831 Chronic kidney disease, stage 3a: Secondary | ICD-10-CM | POA: Diagnosis not present

## 2022-09-17 DIAGNOSIS — D0462 Carcinoma in situ of skin of left upper limb, including shoulder: Secondary | ICD-10-CM | POA: Diagnosis not present

## 2022-09-17 DIAGNOSIS — C44629 Squamous cell carcinoma of skin of left upper limb, including shoulder: Secondary | ICD-10-CM | POA: Diagnosis not present

## 2022-10-17 DIAGNOSIS — D0462 Carcinoma in situ of skin of left upper limb, including shoulder: Secondary | ICD-10-CM | POA: Diagnosis not present

## 2022-10-17 DIAGNOSIS — Z85828 Personal history of other malignant neoplasm of skin: Secondary | ICD-10-CM | POA: Diagnosis not present

## 2022-12-11 DIAGNOSIS — F324 Major depressive disorder, single episode, in partial remission: Secondary | ICD-10-CM | POA: Diagnosis not present

## 2022-12-11 DIAGNOSIS — Z8249 Family history of ischemic heart disease and other diseases of the circulatory system: Secondary | ICD-10-CM | POA: Diagnosis not present

## 2022-12-11 DIAGNOSIS — E785 Hyperlipidemia, unspecified: Secondary | ICD-10-CM | POA: Diagnosis not present

## 2022-12-11 DIAGNOSIS — Z823 Family history of stroke: Secondary | ICD-10-CM | POA: Diagnosis not present

## 2022-12-11 DIAGNOSIS — Z809 Family history of malignant neoplasm, unspecified: Secondary | ICD-10-CM | POA: Diagnosis not present

## 2022-12-11 DIAGNOSIS — I2510 Atherosclerotic heart disease of native coronary artery without angina pectoris: Secondary | ICD-10-CM | POA: Diagnosis not present

## 2022-12-11 DIAGNOSIS — G629 Polyneuropathy, unspecified: Secondary | ICD-10-CM | POA: Diagnosis not present

## 2022-12-11 DIAGNOSIS — N1831 Chronic kidney disease, stage 3a: Secondary | ICD-10-CM | POA: Diagnosis not present

## 2022-12-11 DIAGNOSIS — M1990 Unspecified osteoarthritis, unspecified site: Secondary | ICD-10-CM | POA: Diagnosis not present

## 2022-12-11 DIAGNOSIS — K219 Gastro-esophageal reflux disease without esophagitis: Secondary | ICD-10-CM | POA: Diagnosis not present

## 2022-12-11 DIAGNOSIS — R600 Localized edema: Secondary | ICD-10-CM | POA: Diagnosis not present

## 2022-12-11 DIAGNOSIS — I129 Hypertensive chronic kidney disease with stage 1 through stage 4 chronic kidney disease, or unspecified chronic kidney disease: Secondary | ICD-10-CM | POA: Diagnosis not present

## 2023-01-02 DIAGNOSIS — D0462 Carcinoma in situ of skin of left upper limb, including shoulder: Secondary | ICD-10-CM | POA: Diagnosis not present

## 2023-01-02 DIAGNOSIS — L578 Other skin changes due to chronic exposure to nonionizing radiation: Secondary | ICD-10-CM | POA: Diagnosis not present

## 2023-01-02 DIAGNOSIS — W908XXS Exposure to other nonionizing radiation, sequela: Secondary | ICD-10-CM | POA: Diagnosis not present

## 2023-02-13 DIAGNOSIS — Z1231 Encounter for screening mammogram for malignant neoplasm of breast: Secondary | ICD-10-CM | POA: Diagnosis not present

## 2023-02-25 DIAGNOSIS — Z0000 Encounter for general adult medical examination without abnormal findings: Secondary | ICD-10-CM | POA: Diagnosis not present

## 2023-02-25 DIAGNOSIS — R7303 Prediabetes: Secondary | ICD-10-CM | POA: Diagnosis not present

## 2023-02-25 DIAGNOSIS — E785 Hyperlipidemia, unspecified: Secondary | ICD-10-CM | POA: Diagnosis not present

## 2023-02-25 DIAGNOSIS — N183 Chronic kidney disease, stage 3 unspecified: Secondary | ICD-10-CM | POA: Diagnosis not present

## 2023-02-25 DIAGNOSIS — M8580 Other specified disorders of bone density and structure, unspecified site: Secondary | ICD-10-CM | POA: Diagnosis not present

## 2023-02-25 DIAGNOSIS — M159 Polyosteoarthritis, unspecified: Secondary | ICD-10-CM | POA: Diagnosis not present

## 2023-02-25 DIAGNOSIS — Z96653 Presence of artificial knee joint, bilateral: Secondary | ICD-10-CM | POA: Diagnosis not present

## 2023-02-25 DIAGNOSIS — I129 Hypertensive chronic kidney disease with stage 1 through stage 4 chronic kidney disease, or unspecified chronic kidney disease: Secondary | ICD-10-CM | POA: Diagnosis not present

## 2023-02-25 DIAGNOSIS — E559 Vitamin D deficiency, unspecified: Secondary | ICD-10-CM | POA: Diagnosis not present

## 2023-02-25 DIAGNOSIS — Z23 Encounter for immunization: Secondary | ICD-10-CM | POA: Diagnosis not present

## 2023-02-25 DIAGNOSIS — I10 Essential (primary) hypertension: Secondary | ICD-10-CM | POA: Diagnosis not present

## 2023-03-08 DIAGNOSIS — S72001A Fracture of unspecified part of neck of right femur, initial encounter for closed fracture: Secondary | ICD-10-CM | POA: Diagnosis not present

## 2023-03-08 DIAGNOSIS — W010XXA Fall on same level from slipping, tripping and stumbling without subsequent striking against object, initial encounter: Secondary | ICD-10-CM | POA: Diagnosis not present

## 2023-03-08 DIAGNOSIS — Z743 Need for continuous supervision: Secondary | ICD-10-CM | POA: Diagnosis not present

## 2023-03-08 DIAGNOSIS — Z01818 Encounter for other preprocedural examination: Secondary | ICD-10-CM | POA: Diagnosis not present

## 2023-03-08 DIAGNOSIS — W19XXXA Unspecified fall, initial encounter: Secondary | ICD-10-CM | POA: Diagnosis not present

## 2023-03-08 DIAGNOSIS — S79919A Unspecified injury of unspecified hip, initial encounter: Secondary | ICD-10-CM | POA: Diagnosis not present

## 2023-03-09 DIAGNOSIS — I10 Essential (primary) hypertension: Secondary | ICD-10-CM | POA: Diagnosis not present

## 2023-03-09 DIAGNOSIS — M1611 Unilateral primary osteoarthritis, right hip: Secondary | ICD-10-CM | POA: Diagnosis not present

## 2023-03-09 DIAGNOSIS — Z96641 Presence of right artificial hip joint: Secondary | ICD-10-CM | POA: Diagnosis not present

## 2023-03-09 DIAGNOSIS — S72001A Fracture of unspecified part of neck of right femur, initial encounter for closed fracture: Secondary | ICD-10-CM | POA: Diagnosis not present

## 2023-03-09 DIAGNOSIS — K219 Gastro-esophageal reflux disease without esophagitis: Secondary | ICD-10-CM | POA: Diagnosis not present

## 2023-03-09 DIAGNOSIS — I480 Paroxysmal atrial fibrillation: Secondary | ICD-10-CM | POA: Diagnosis not present

## 2023-03-09 DIAGNOSIS — S72091A Other fracture of head and neck of right femur, initial encounter for closed fracture: Secondary | ICD-10-CM | POA: Diagnosis not present

## 2023-03-09 DIAGNOSIS — M1612 Unilateral primary osteoarthritis, left hip: Secondary | ICD-10-CM | POA: Diagnosis not present

## 2023-03-10 DIAGNOSIS — Z96612 Presence of left artificial shoulder joint: Secondary | ICD-10-CM | POA: Diagnosis not present

## 2023-03-10 DIAGNOSIS — Z7982 Long term (current) use of aspirin: Secondary | ICD-10-CM | POA: Diagnosis not present

## 2023-03-10 DIAGNOSIS — F32A Depression, unspecified: Secondary | ICD-10-CM | POA: Diagnosis not present

## 2023-03-10 DIAGNOSIS — S72001A Fracture of unspecified part of neck of right femur, initial encounter for closed fracture: Secondary | ICD-10-CM | POA: Diagnosis not present

## 2023-03-10 DIAGNOSIS — Z79899 Other long term (current) drug therapy: Secondary | ICD-10-CM | POA: Diagnosis not present

## 2023-03-10 DIAGNOSIS — E785 Hyperlipidemia, unspecified: Secondary | ICD-10-CM | POA: Diagnosis not present

## 2023-03-10 DIAGNOSIS — I10 Essential (primary) hypertension: Secondary | ICD-10-CM | POA: Diagnosis not present

## 2023-03-10 DIAGNOSIS — M1611 Unilateral primary osteoarthritis, right hip: Secondary | ICD-10-CM | POA: Diagnosis not present

## 2023-03-10 DIAGNOSIS — K219 Gastro-esophageal reflux disease without esophagitis: Secondary | ICD-10-CM | POA: Diagnosis not present

## 2023-03-10 DIAGNOSIS — Y9389 Activity, other specified: Secondary | ICD-10-CM | POA: Diagnosis not present

## 2023-03-10 DIAGNOSIS — D62 Acute posthemorrhagic anemia: Secondary | ICD-10-CM | POA: Diagnosis not present

## 2023-03-10 DIAGNOSIS — I480 Paroxysmal atrial fibrillation: Secondary | ICD-10-CM | POA: Diagnosis not present

## 2023-03-10 DIAGNOSIS — E876 Hypokalemia: Secondary | ICD-10-CM | POA: Diagnosis not present

## 2023-03-10 DIAGNOSIS — Z96611 Presence of right artificial shoulder joint: Secondary | ICD-10-CM | POA: Diagnosis not present

## 2023-03-10 DIAGNOSIS — Z96641 Presence of right artificial hip joint: Secondary | ICD-10-CM | POA: Diagnosis not present

## 2023-03-10 DIAGNOSIS — Z96653 Presence of artificial knee joint, bilateral: Secondary | ICD-10-CM | POA: Diagnosis not present

## 2023-03-10 DIAGNOSIS — M1612 Unilateral primary osteoarthritis, left hip: Secondary | ICD-10-CM | POA: Diagnosis not present

## 2023-03-10 DIAGNOSIS — W010XXA Fall on same level from slipping, tripping and stumbling without subsequent striking against object, initial encounter: Secondary | ICD-10-CM | POA: Diagnosis not present

## 2023-03-10 DIAGNOSIS — Z8673 Personal history of transient ischemic attack (TIA), and cerebral infarction without residual deficits: Secondary | ICD-10-CM | POA: Diagnosis not present

## 2023-03-10 DIAGNOSIS — Y92481 Parking lot as the place of occurrence of the external cause: Secondary | ICD-10-CM | POA: Diagnosis not present

## 2023-03-12 DIAGNOSIS — Z7901 Long term (current) use of anticoagulants: Secondary | ICD-10-CM | POA: Diagnosis not present

## 2023-03-12 DIAGNOSIS — K5900 Constipation, unspecified: Secondary | ICD-10-CM | POA: Diagnosis not present

## 2023-03-12 DIAGNOSIS — I10 Essential (primary) hypertension: Secondary | ICD-10-CM | POA: Diagnosis not present

## 2023-03-12 DIAGNOSIS — D500 Iron deficiency anemia secondary to blood loss (chronic): Secondary | ICD-10-CM | POA: Diagnosis not present

## 2023-03-12 DIAGNOSIS — G4700 Insomnia, unspecified: Secondary | ICD-10-CM | POA: Diagnosis not present

## 2023-03-12 DIAGNOSIS — K219 Gastro-esophageal reflux disease without esophagitis: Secondary | ICD-10-CM | POA: Diagnosis not present

## 2023-03-12 DIAGNOSIS — D631 Anemia in chronic kidney disease: Secondary | ICD-10-CM | POA: Diagnosis not present

## 2023-03-12 DIAGNOSIS — N182 Chronic kidney disease, stage 2 (mild): Secondary | ICD-10-CM | POA: Diagnosis not present

## 2023-03-12 DIAGNOSIS — Z9181 History of falling: Secondary | ICD-10-CM | POA: Diagnosis not present

## 2023-03-12 DIAGNOSIS — I129 Hypertensive chronic kidney disease with stage 1 through stage 4 chronic kidney disease, or unspecified chronic kidney disease: Secondary | ICD-10-CM | POA: Diagnosis not present

## 2023-03-12 DIAGNOSIS — M25551 Pain in right hip: Secondary | ICD-10-CM | POA: Diagnosis not present

## 2023-03-12 DIAGNOSIS — Z743 Need for continuous supervision: Secondary | ICD-10-CM | POA: Diagnosis not present

## 2023-03-12 DIAGNOSIS — Z8673 Personal history of transient ischemic attack (TIA), and cerebral infarction without residual deficits: Secondary | ICD-10-CM | POA: Diagnosis not present

## 2023-03-12 DIAGNOSIS — D649 Anemia, unspecified: Secondary | ICD-10-CM | POA: Diagnosis not present

## 2023-03-12 DIAGNOSIS — S72001A Fracture of unspecified part of neck of right femur, initial encounter for closed fracture: Secondary | ICD-10-CM | POA: Diagnosis not present

## 2023-03-12 DIAGNOSIS — I480 Paroxysmal atrial fibrillation: Secondary | ICD-10-CM | POA: Diagnosis not present

## 2023-03-12 DIAGNOSIS — E876 Hypokalemia: Secondary | ICD-10-CM | POA: Diagnosis not present

## 2023-03-12 DIAGNOSIS — E785 Hyperlipidemia, unspecified: Secondary | ICD-10-CM | POA: Diagnosis not present

## 2023-03-12 DIAGNOSIS — Z7982 Long term (current) use of aspirin: Secondary | ICD-10-CM | POA: Diagnosis not present

## 2023-03-12 DIAGNOSIS — F32A Depression, unspecified: Secondary | ICD-10-CM | POA: Diagnosis not present

## 2023-03-12 DIAGNOSIS — Z4789 Encounter for other orthopedic aftercare: Secondary | ICD-10-CM | POA: Diagnosis not present

## 2023-03-12 DIAGNOSIS — S72041D Displaced fracture of base of neck of right femur, subsequent encounter for closed fracture with routine healing: Secondary | ICD-10-CM | POA: Diagnosis not present

## 2023-03-12 DIAGNOSIS — I959 Hypotension, unspecified: Secondary | ICD-10-CM | POA: Diagnosis not present

## 2023-03-15 DIAGNOSIS — K219 Gastro-esophageal reflux disease without esophagitis: Secondary | ICD-10-CM | POA: Diagnosis not present

## 2023-03-15 DIAGNOSIS — Z9181 History of falling: Secondary | ICD-10-CM | POA: Diagnosis not present

## 2023-03-15 DIAGNOSIS — Z7901 Long term (current) use of anticoagulants: Secondary | ICD-10-CM | POA: Diagnosis not present

## 2023-03-15 DIAGNOSIS — I480 Paroxysmal atrial fibrillation: Secondary | ICD-10-CM | POA: Diagnosis not present

## 2023-03-15 DIAGNOSIS — Z7982 Long term (current) use of aspirin: Secondary | ICD-10-CM | POA: Diagnosis not present

## 2023-03-15 DIAGNOSIS — I129 Hypertensive chronic kidney disease with stage 1 through stage 4 chronic kidney disease, or unspecified chronic kidney disease: Secondary | ICD-10-CM | POA: Diagnosis not present

## 2023-03-15 DIAGNOSIS — N182 Chronic kidney disease, stage 2 (mild): Secondary | ICD-10-CM | POA: Diagnosis not present

## 2023-03-15 DIAGNOSIS — D631 Anemia in chronic kidney disease: Secondary | ICD-10-CM | POA: Diagnosis not present

## 2023-03-15 DIAGNOSIS — S72041D Displaced fracture of base of neck of right femur, subsequent encounter for closed fracture with routine healing: Secondary | ICD-10-CM | POA: Diagnosis not present

## 2023-03-15 DIAGNOSIS — E785 Hyperlipidemia, unspecified: Secondary | ICD-10-CM | POA: Diagnosis not present

## 2023-03-15 DIAGNOSIS — Z8673 Personal history of transient ischemic attack (TIA), and cerebral infarction without residual deficits: Secondary | ICD-10-CM | POA: Diagnosis not present

## 2023-03-20 DIAGNOSIS — I480 Paroxysmal atrial fibrillation: Secondary | ICD-10-CM | POA: Diagnosis not present

## 2023-03-20 DIAGNOSIS — S72041D Displaced fracture of base of neck of right femur, subsequent encounter for closed fracture with routine healing: Secondary | ICD-10-CM | POA: Diagnosis not present

## 2023-03-20 DIAGNOSIS — I10 Essential (primary) hypertension: Secondary | ICD-10-CM | POA: Diagnosis not present

## 2023-03-20 DIAGNOSIS — D649 Anemia, unspecified: Secondary | ICD-10-CM | POA: Diagnosis not present

## 2023-03-20 DIAGNOSIS — E785 Hyperlipidemia, unspecified: Secondary | ICD-10-CM | POA: Diagnosis not present

## 2023-03-20 DIAGNOSIS — K219 Gastro-esophageal reflux disease without esophagitis: Secondary | ICD-10-CM | POA: Diagnosis not present

## 2023-03-20 DIAGNOSIS — Z8673 Personal history of transient ischemic attack (TIA), and cerebral infarction without residual deficits: Secondary | ICD-10-CM | POA: Diagnosis not present

## 2023-03-29 DIAGNOSIS — S72041D Displaced fracture of base of neck of right femur, subsequent encounter for closed fracture with routine healing: Secondary | ICD-10-CM | POA: Diagnosis not present

## 2023-04-02 DIAGNOSIS — S72041D Displaced fracture of base of neck of right femur, subsequent encounter for closed fracture with routine healing: Secondary | ICD-10-CM | POA: Diagnosis not present

## 2023-04-03 DIAGNOSIS — I129 Hypertensive chronic kidney disease with stage 1 through stage 4 chronic kidney disease, or unspecified chronic kidney disease: Secondary | ICD-10-CM | POA: Diagnosis not present

## 2023-04-03 DIAGNOSIS — Z8673 Personal history of transient ischemic attack (TIA), and cerebral infarction without residual deficits: Secondary | ICD-10-CM | POA: Diagnosis not present

## 2023-04-03 DIAGNOSIS — M25551 Pain in right hip: Secondary | ICD-10-CM | POA: Diagnosis not present

## 2023-04-03 DIAGNOSIS — I480 Paroxysmal atrial fibrillation: Secondary | ICD-10-CM | POA: Diagnosis not present

## 2023-04-03 DIAGNOSIS — K219 Gastro-esophageal reflux disease without esophagitis: Secondary | ICD-10-CM | POA: Diagnosis not present

## 2023-04-03 DIAGNOSIS — D631 Anemia in chronic kidney disease: Secondary | ICD-10-CM | POA: Diagnosis not present

## 2023-04-03 DIAGNOSIS — E785 Hyperlipidemia, unspecified: Secondary | ICD-10-CM | POA: Diagnosis not present

## 2023-04-03 DIAGNOSIS — N182 Chronic kidney disease, stage 2 (mild): Secondary | ICD-10-CM | POA: Diagnosis not present

## 2023-04-03 DIAGNOSIS — Z7982 Long term (current) use of aspirin: Secondary | ICD-10-CM | POA: Diagnosis not present

## 2023-04-03 DIAGNOSIS — S72041D Displaced fracture of base of neck of right femur, subsequent encounter for closed fracture with routine healing: Secondary | ICD-10-CM | POA: Diagnosis not present

## 2023-04-07 DIAGNOSIS — Z96641 Presence of right artificial hip joint: Secondary | ICD-10-CM | POA: Diagnosis not present

## 2023-04-07 DIAGNOSIS — I129 Hypertensive chronic kidney disease with stage 1 through stage 4 chronic kidney disease, or unspecified chronic kidney disease: Secondary | ICD-10-CM | POA: Diagnosis not present

## 2023-04-07 DIAGNOSIS — Z8673 Personal history of transient ischemic attack (TIA), and cerebral infarction without residual deficits: Secondary | ICD-10-CM | POA: Diagnosis not present

## 2023-04-07 DIAGNOSIS — E785 Hyperlipidemia, unspecified: Secondary | ICD-10-CM | POA: Diagnosis not present

## 2023-04-07 DIAGNOSIS — Z9181 History of falling: Secondary | ICD-10-CM | POA: Diagnosis not present

## 2023-04-07 DIAGNOSIS — D62 Acute posthemorrhagic anemia: Secondary | ICD-10-CM | POA: Diagnosis not present

## 2023-04-07 DIAGNOSIS — Z7982 Long term (current) use of aspirin: Secondary | ICD-10-CM | POA: Diagnosis not present

## 2023-04-07 DIAGNOSIS — W19XXXD Unspecified fall, subsequent encounter: Secondary | ICD-10-CM | POA: Diagnosis not present

## 2023-04-07 DIAGNOSIS — K219 Gastro-esophageal reflux disease without esophagitis: Secondary | ICD-10-CM | POA: Diagnosis not present

## 2023-04-07 DIAGNOSIS — I480 Paroxysmal atrial fibrillation: Secondary | ICD-10-CM | POA: Diagnosis not present

## 2023-04-07 DIAGNOSIS — M1990 Unspecified osteoarthritis, unspecified site: Secondary | ICD-10-CM | POA: Diagnosis not present

## 2023-04-07 DIAGNOSIS — S72041D Displaced fracture of base of neck of right femur, subsequent encounter for closed fracture with routine healing: Secondary | ICD-10-CM | POA: Diagnosis not present

## 2023-04-07 DIAGNOSIS — N182 Chronic kidney disease, stage 2 (mild): Secondary | ICD-10-CM | POA: Diagnosis not present

## 2023-04-09 DIAGNOSIS — N1831 Chronic kidney disease, stage 3a: Secondary | ICD-10-CM | POA: Diagnosis not present

## 2023-04-09 DIAGNOSIS — Z9989 Dependence on other enabling machines and devices: Secondary | ICD-10-CM | POA: Diagnosis not present

## 2023-04-09 DIAGNOSIS — Z96641 Presence of right artificial hip joint: Secondary | ICD-10-CM | POA: Diagnosis not present

## 2023-04-09 DIAGNOSIS — G8929 Other chronic pain: Secondary | ICD-10-CM | POA: Diagnosis not present

## 2023-04-09 DIAGNOSIS — D509 Iron deficiency anemia, unspecified: Secondary | ICD-10-CM | POA: Diagnosis not present

## 2023-04-09 DIAGNOSIS — E782 Mixed hyperlipidemia: Secondary | ICD-10-CM | POA: Diagnosis not present

## 2023-04-09 DIAGNOSIS — I10 Essential (primary) hypertension: Secondary | ICD-10-CM | POA: Diagnosis not present

## 2023-04-09 DIAGNOSIS — Z23 Encounter for immunization: Secondary | ICD-10-CM | POA: Diagnosis not present

## 2023-04-10 DIAGNOSIS — Z96641 Presence of right artificial hip joint: Secondary | ICD-10-CM | POA: Diagnosis not present

## 2023-04-10 DIAGNOSIS — D62 Acute posthemorrhagic anemia: Secondary | ICD-10-CM | POA: Diagnosis not present

## 2023-04-10 DIAGNOSIS — M1990 Unspecified osteoarthritis, unspecified site: Secondary | ICD-10-CM | POA: Diagnosis not present

## 2023-04-10 DIAGNOSIS — I480 Paroxysmal atrial fibrillation: Secondary | ICD-10-CM | POA: Diagnosis not present

## 2023-04-10 DIAGNOSIS — K219 Gastro-esophageal reflux disease without esophagitis: Secondary | ICD-10-CM | POA: Diagnosis not present

## 2023-04-10 DIAGNOSIS — Z8673 Personal history of transient ischemic attack (TIA), and cerebral infarction without residual deficits: Secondary | ICD-10-CM | POA: Diagnosis not present

## 2023-04-10 DIAGNOSIS — E785 Hyperlipidemia, unspecified: Secondary | ICD-10-CM | POA: Diagnosis not present

## 2023-04-10 DIAGNOSIS — N182 Chronic kidney disease, stage 2 (mild): Secondary | ICD-10-CM | POA: Diagnosis not present

## 2023-04-10 DIAGNOSIS — Z7982 Long term (current) use of aspirin: Secondary | ICD-10-CM | POA: Diagnosis not present

## 2023-04-10 DIAGNOSIS — I129 Hypertensive chronic kidney disease with stage 1 through stage 4 chronic kidney disease, or unspecified chronic kidney disease: Secondary | ICD-10-CM | POA: Diagnosis not present

## 2023-04-10 DIAGNOSIS — W19XXXD Unspecified fall, subsequent encounter: Secondary | ICD-10-CM | POA: Diagnosis not present

## 2023-04-10 DIAGNOSIS — Z9181 History of falling: Secondary | ICD-10-CM | POA: Diagnosis not present

## 2023-04-10 DIAGNOSIS — S72041D Displaced fracture of base of neck of right femur, subsequent encounter for closed fracture with routine healing: Secondary | ICD-10-CM | POA: Diagnosis not present

## 2023-04-11 DIAGNOSIS — Z9181 History of falling: Secondary | ICD-10-CM | POA: Diagnosis not present

## 2023-04-11 DIAGNOSIS — D62 Acute posthemorrhagic anemia: Secondary | ICD-10-CM | POA: Diagnosis not present

## 2023-04-11 DIAGNOSIS — Z8673 Personal history of transient ischemic attack (TIA), and cerebral infarction without residual deficits: Secondary | ICD-10-CM | POA: Diagnosis not present

## 2023-04-11 DIAGNOSIS — E785 Hyperlipidemia, unspecified: Secondary | ICD-10-CM | POA: Diagnosis not present

## 2023-04-11 DIAGNOSIS — Z96641 Presence of right artificial hip joint: Secondary | ICD-10-CM | POA: Diagnosis not present

## 2023-04-11 DIAGNOSIS — I480 Paroxysmal atrial fibrillation: Secondary | ICD-10-CM | POA: Diagnosis not present

## 2023-04-11 DIAGNOSIS — I129 Hypertensive chronic kidney disease with stage 1 through stage 4 chronic kidney disease, or unspecified chronic kidney disease: Secondary | ICD-10-CM | POA: Diagnosis not present

## 2023-04-11 DIAGNOSIS — Z7982 Long term (current) use of aspirin: Secondary | ICD-10-CM | POA: Diagnosis not present

## 2023-04-11 DIAGNOSIS — M1990 Unspecified osteoarthritis, unspecified site: Secondary | ICD-10-CM | POA: Diagnosis not present

## 2023-04-11 DIAGNOSIS — K219 Gastro-esophageal reflux disease without esophagitis: Secondary | ICD-10-CM | POA: Diagnosis not present

## 2023-04-11 DIAGNOSIS — W19XXXD Unspecified fall, subsequent encounter: Secondary | ICD-10-CM | POA: Diagnosis not present

## 2023-04-11 DIAGNOSIS — S72041D Displaced fracture of base of neck of right femur, subsequent encounter for closed fracture with routine healing: Secondary | ICD-10-CM | POA: Diagnosis not present

## 2023-04-11 DIAGNOSIS — N182 Chronic kidney disease, stage 2 (mild): Secondary | ICD-10-CM | POA: Diagnosis not present

## 2023-04-15 DIAGNOSIS — Z96641 Presence of right artificial hip joint: Secondary | ICD-10-CM | POA: Diagnosis not present

## 2023-04-15 DIAGNOSIS — K219 Gastro-esophageal reflux disease without esophagitis: Secondary | ICD-10-CM | POA: Diagnosis not present

## 2023-04-15 DIAGNOSIS — Z8673 Personal history of transient ischemic attack (TIA), and cerebral infarction without residual deficits: Secondary | ICD-10-CM | POA: Diagnosis not present

## 2023-04-15 DIAGNOSIS — D62 Acute posthemorrhagic anemia: Secondary | ICD-10-CM | POA: Diagnosis not present

## 2023-04-15 DIAGNOSIS — Z7982 Long term (current) use of aspirin: Secondary | ICD-10-CM | POA: Diagnosis not present

## 2023-04-15 DIAGNOSIS — S72041D Displaced fracture of base of neck of right femur, subsequent encounter for closed fracture with routine healing: Secondary | ICD-10-CM | POA: Diagnosis not present

## 2023-04-15 DIAGNOSIS — I480 Paroxysmal atrial fibrillation: Secondary | ICD-10-CM | POA: Diagnosis not present

## 2023-04-15 DIAGNOSIS — W19XXXD Unspecified fall, subsequent encounter: Secondary | ICD-10-CM | POA: Diagnosis not present

## 2023-04-15 DIAGNOSIS — Z9181 History of falling: Secondary | ICD-10-CM | POA: Diagnosis not present

## 2023-04-15 DIAGNOSIS — E785 Hyperlipidemia, unspecified: Secondary | ICD-10-CM | POA: Diagnosis not present

## 2023-04-15 DIAGNOSIS — N182 Chronic kidney disease, stage 2 (mild): Secondary | ICD-10-CM | POA: Diagnosis not present

## 2023-04-15 DIAGNOSIS — M1990 Unspecified osteoarthritis, unspecified site: Secondary | ICD-10-CM | POA: Diagnosis not present

## 2023-04-15 DIAGNOSIS — I129 Hypertensive chronic kidney disease with stage 1 through stage 4 chronic kidney disease, or unspecified chronic kidney disease: Secondary | ICD-10-CM | POA: Diagnosis not present

## 2023-04-17 DIAGNOSIS — Z96641 Presence of right artificial hip joint: Secondary | ICD-10-CM | POA: Diagnosis not present

## 2023-04-17 DIAGNOSIS — Z7982 Long term (current) use of aspirin: Secondary | ICD-10-CM | POA: Diagnosis not present

## 2023-04-17 DIAGNOSIS — M1990 Unspecified osteoarthritis, unspecified site: Secondary | ICD-10-CM | POA: Diagnosis not present

## 2023-04-17 DIAGNOSIS — Z9181 History of falling: Secondary | ICD-10-CM | POA: Diagnosis not present

## 2023-04-17 DIAGNOSIS — D62 Acute posthemorrhagic anemia: Secondary | ICD-10-CM | POA: Diagnosis not present

## 2023-04-17 DIAGNOSIS — I480 Paroxysmal atrial fibrillation: Secondary | ICD-10-CM | POA: Diagnosis not present

## 2023-04-17 DIAGNOSIS — S72041D Displaced fracture of base of neck of right femur, subsequent encounter for closed fracture with routine healing: Secondary | ICD-10-CM | POA: Diagnosis not present

## 2023-04-17 DIAGNOSIS — I129 Hypertensive chronic kidney disease with stage 1 through stage 4 chronic kidney disease, or unspecified chronic kidney disease: Secondary | ICD-10-CM | POA: Diagnosis not present

## 2023-04-17 DIAGNOSIS — E785 Hyperlipidemia, unspecified: Secondary | ICD-10-CM | POA: Diagnosis not present

## 2023-04-17 DIAGNOSIS — K219 Gastro-esophageal reflux disease without esophagitis: Secondary | ICD-10-CM | POA: Diagnosis not present

## 2023-04-17 DIAGNOSIS — W19XXXD Unspecified fall, subsequent encounter: Secondary | ICD-10-CM | POA: Diagnosis not present

## 2023-04-17 DIAGNOSIS — N182 Chronic kidney disease, stage 2 (mild): Secondary | ICD-10-CM | POA: Diagnosis not present

## 2023-04-17 DIAGNOSIS — Z8673 Personal history of transient ischemic attack (TIA), and cerebral infarction without residual deficits: Secondary | ICD-10-CM | POA: Diagnosis not present

## 2023-04-19 DIAGNOSIS — Z8673 Personal history of transient ischemic attack (TIA), and cerebral infarction without residual deficits: Secondary | ICD-10-CM | POA: Diagnosis not present

## 2023-04-19 DIAGNOSIS — Z96641 Presence of right artificial hip joint: Secondary | ICD-10-CM | POA: Diagnosis not present

## 2023-04-19 DIAGNOSIS — S72041D Displaced fracture of base of neck of right femur, subsequent encounter for closed fracture with routine healing: Secondary | ICD-10-CM | POA: Diagnosis not present

## 2023-04-19 DIAGNOSIS — W19XXXD Unspecified fall, subsequent encounter: Secondary | ICD-10-CM | POA: Diagnosis not present

## 2023-04-19 DIAGNOSIS — M1990 Unspecified osteoarthritis, unspecified site: Secondary | ICD-10-CM | POA: Diagnosis not present

## 2023-04-19 DIAGNOSIS — E785 Hyperlipidemia, unspecified: Secondary | ICD-10-CM | POA: Diagnosis not present

## 2023-04-19 DIAGNOSIS — K219 Gastro-esophageal reflux disease without esophagitis: Secondary | ICD-10-CM | POA: Diagnosis not present

## 2023-04-19 DIAGNOSIS — N182 Chronic kidney disease, stage 2 (mild): Secondary | ICD-10-CM | POA: Diagnosis not present

## 2023-04-19 DIAGNOSIS — I480 Paroxysmal atrial fibrillation: Secondary | ICD-10-CM | POA: Diagnosis not present

## 2023-04-19 DIAGNOSIS — Z9181 History of falling: Secondary | ICD-10-CM | POA: Diagnosis not present

## 2023-04-19 DIAGNOSIS — Z7982 Long term (current) use of aspirin: Secondary | ICD-10-CM | POA: Diagnosis not present

## 2023-04-19 DIAGNOSIS — I129 Hypertensive chronic kidney disease with stage 1 through stage 4 chronic kidney disease, or unspecified chronic kidney disease: Secondary | ICD-10-CM | POA: Diagnosis not present

## 2023-04-19 DIAGNOSIS — D62 Acute posthemorrhagic anemia: Secondary | ICD-10-CM | POA: Diagnosis not present

## 2023-04-22 DIAGNOSIS — M1990 Unspecified osteoarthritis, unspecified site: Secondary | ICD-10-CM | POA: Diagnosis not present

## 2023-04-22 DIAGNOSIS — Z9181 History of falling: Secondary | ICD-10-CM | POA: Diagnosis not present

## 2023-04-22 DIAGNOSIS — I480 Paroxysmal atrial fibrillation: Secondary | ICD-10-CM | POA: Diagnosis not present

## 2023-04-22 DIAGNOSIS — I129 Hypertensive chronic kidney disease with stage 1 through stage 4 chronic kidney disease, or unspecified chronic kidney disease: Secondary | ICD-10-CM | POA: Diagnosis not present

## 2023-04-22 DIAGNOSIS — N182 Chronic kidney disease, stage 2 (mild): Secondary | ICD-10-CM | POA: Diagnosis not present

## 2023-04-22 DIAGNOSIS — Z96641 Presence of right artificial hip joint: Secondary | ICD-10-CM | POA: Diagnosis not present

## 2023-04-22 DIAGNOSIS — S72041D Displaced fracture of base of neck of right femur, subsequent encounter for closed fracture with routine healing: Secondary | ICD-10-CM | POA: Diagnosis not present

## 2023-04-22 DIAGNOSIS — E785 Hyperlipidemia, unspecified: Secondary | ICD-10-CM | POA: Diagnosis not present

## 2023-04-22 DIAGNOSIS — K219 Gastro-esophageal reflux disease without esophagitis: Secondary | ICD-10-CM | POA: Diagnosis not present

## 2023-04-22 DIAGNOSIS — Z7982 Long term (current) use of aspirin: Secondary | ICD-10-CM | POA: Diagnosis not present

## 2023-04-22 DIAGNOSIS — Z8673 Personal history of transient ischemic attack (TIA), and cerebral infarction without residual deficits: Secondary | ICD-10-CM | POA: Diagnosis not present

## 2023-04-22 DIAGNOSIS — W19XXXD Unspecified fall, subsequent encounter: Secondary | ICD-10-CM | POA: Diagnosis not present

## 2023-04-22 DIAGNOSIS — D62 Acute posthemorrhagic anemia: Secondary | ICD-10-CM | POA: Diagnosis not present

## 2023-04-25 DIAGNOSIS — W19XXXD Unspecified fall, subsequent encounter: Secondary | ICD-10-CM | POA: Diagnosis not present

## 2023-04-25 DIAGNOSIS — E785 Hyperlipidemia, unspecified: Secondary | ICD-10-CM | POA: Diagnosis not present

## 2023-04-25 DIAGNOSIS — Z9181 History of falling: Secondary | ICD-10-CM | POA: Diagnosis not present

## 2023-04-25 DIAGNOSIS — K219 Gastro-esophageal reflux disease without esophagitis: Secondary | ICD-10-CM | POA: Diagnosis not present

## 2023-04-25 DIAGNOSIS — N182 Chronic kidney disease, stage 2 (mild): Secondary | ICD-10-CM | POA: Diagnosis not present

## 2023-04-25 DIAGNOSIS — I129 Hypertensive chronic kidney disease with stage 1 through stage 4 chronic kidney disease, or unspecified chronic kidney disease: Secondary | ICD-10-CM | POA: Diagnosis not present

## 2023-04-25 DIAGNOSIS — S72041D Displaced fracture of base of neck of right femur, subsequent encounter for closed fracture with routine healing: Secondary | ICD-10-CM | POA: Diagnosis not present

## 2023-04-25 DIAGNOSIS — D62 Acute posthemorrhagic anemia: Secondary | ICD-10-CM | POA: Diagnosis not present

## 2023-04-25 DIAGNOSIS — Z7982 Long term (current) use of aspirin: Secondary | ICD-10-CM | POA: Diagnosis not present

## 2023-04-25 DIAGNOSIS — I480 Paroxysmal atrial fibrillation: Secondary | ICD-10-CM | POA: Diagnosis not present

## 2023-04-25 DIAGNOSIS — Z8673 Personal history of transient ischemic attack (TIA), and cerebral infarction without residual deficits: Secondary | ICD-10-CM | POA: Diagnosis not present

## 2023-04-25 DIAGNOSIS — Z96641 Presence of right artificial hip joint: Secondary | ICD-10-CM | POA: Diagnosis not present

## 2023-04-25 DIAGNOSIS — M1990 Unspecified osteoarthritis, unspecified site: Secondary | ICD-10-CM | POA: Diagnosis not present

## 2023-04-26 DIAGNOSIS — E785 Hyperlipidemia, unspecified: Secondary | ICD-10-CM | POA: Diagnosis not present

## 2023-04-26 DIAGNOSIS — K219 Gastro-esophageal reflux disease without esophagitis: Secondary | ICD-10-CM | POA: Diagnosis not present

## 2023-04-26 DIAGNOSIS — Z7982 Long term (current) use of aspirin: Secondary | ICD-10-CM | POA: Diagnosis not present

## 2023-04-26 DIAGNOSIS — Z8673 Personal history of transient ischemic attack (TIA), and cerebral infarction without residual deficits: Secondary | ICD-10-CM | POA: Diagnosis not present

## 2023-04-26 DIAGNOSIS — M1990 Unspecified osteoarthritis, unspecified site: Secondary | ICD-10-CM | POA: Diagnosis not present

## 2023-04-26 DIAGNOSIS — S72041D Displaced fracture of base of neck of right femur, subsequent encounter for closed fracture with routine healing: Secondary | ICD-10-CM | POA: Diagnosis not present

## 2023-04-26 DIAGNOSIS — I129 Hypertensive chronic kidney disease with stage 1 through stage 4 chronic kidney disease, or unspecified chronic kidney disease: Secondary | ICD-10-CM | POA: Diagnosis not present

## 2023-04-26 DIAGNOSIS — Z96641 Presence of right artificial hip joint: Secondary | ICD-10-CM | POA: Diagnosis not present

## 2023-04-26 DIAGNOSIS — Z9181 History of falling: Secondary | ICD-10-CM | POA: Diagnosis not present

## 2023-04-26 DIAGNOSIS — D62 Acute posthemorrhagic anemia: Secondary | ICD-10-CM | POA: Diagnosis not present

## 2023-04-26 DIAGNOSIS — W19XXXD Unspecified fall, subsequent encounter: Secondary | ICD-10-CM | POA: Diagnosis not present

## 2023-04-26 DIAGNOSIS — I480 Paroxysmal atrial fibrillation: Secondary | ICD-10-CM | POA: Diagnosis not present

## 2023-04-26 DIAGNOSIS — N182 Chronic kidney disease, stage 2 (mild): Secondary | ICD-10-CM | POA: Diagnosis not present

## 2023-04-29 DIAGNOSIS — Z8673 Personal history of transient ischemic attack (TIA), and cerebral infarction without residual deficits: Secondary | ICD-10-CM | POA: Diagnosis not present

## 2023-04-29 DIAGNOSIS — Z9181 History of falling: Secondary | ICD-10-CM | POA: Diagnosis not present

## 2023-04-29 DIAGNOSIS — M1990 Unspecified osteoarthritis, unspecified site: Secondary | ICD-10-CM | POA: Diagnosis not present

## 2023-04-29 DIAGNOSIS — N182 Chronic kidney disease, stage 2 (mild): Secondary | ICD-10-CM | POA: Diagnosis not present

## 2023-04-29 DIAGNOSIS — D62 Acute posthemorrhagic anemia: Secondary | ICD-10-CM | POA: Diagnosis not present

## 2023-04-29 DIAGNOSIS — I480 Paroxysmal atrial fibrillation: Secondary | ICD-10-CM | POA: Diagnosis not present

## 2023-04-29 DIAGNOSIS — E785 Hyperlipidemia, unspecified: Secondary | ICD-10-CM | POA: Diagnosis not present

## 2023-04-29 DIAGNOSIS — Z96641 Presence of right artificial hip joint: Secondary | ICD-10-CM | POA: Diagnosis not present

## 2023-04-29 DIAGNOSIS — S72041D Displaced fracture of base of neck of right femur, subsequent encounter for closed fracture with routine healing: Secondary | ICD-10-CM | POA: Diagnosis not present

## 2023-04-29 DIAGNOSIS — I129 Hypertensive chronic kidney disease with stage 1 through stage 4 chronic kidney disease, or unspecified chronic kidney disease: Secondary | ICD-10-CM | POA: Diagnosis not present

## 2023-04-29 DIAGNOSIS — Z7982 Long term (current) use of aspirin: Secondary | ICD-10-CM | POA: Diagnosis not present

## 2023-04-29 DIAGNOSIS — W19XXXD Unspecified fall, subsequent encounter: Secondary | ICD-10-CM | POA: Diagnosis not present

## 2023-04-29 DIAGNOSIS — K219 Gastro-esophageal reflux disease without esophagitis: Secondary | ICD-10-CM | POA: Diagnosis not present

## 2023-05-02 DIAGNOSIS — Z9181 History of falling: Secondary | ICD-10-CM | POA: Diagnosis not present

## 2023-05-02 DIAGNOSIS — N182 Chronic kidney disease, stage 2 (mild): Secondary | ICD-10-CM | POA: Diagnosis not present

## 2023-05-02 DIAGNOSIS — M1990 Unspecified osteoarthritis, unspecified site: Secondary | ICD-10-CM | POA: Diagnosis not present

## 2023-05-02 DIAGNOSIS — W19XXXD Unspecified fall, subsequent encounter: Secondary | ICD-10-CM | POA: Diagnosis not present

## 2023-05-02 DIAGNOSIS — Z96641 Presence of right artificial hip joint: Secondary | ICD-10-CM | POA: Diagnosis not present

## 2023-05-02 DIAGNOSIS — I129 Hypertensive chronic kidney disease with stage 1 through stage 4 chronic kidney disease, or unspecified chronic kidney disease: Secondary | ICD-10-CM | POA: Diagnosis not present

## 2023-05-02 DIAGNOSIS — D62 Acute posthemorrhagic anemia: Secondary | ICD-10-CM | POA: Diagnosis not present

## 2023-05-02 DIAGNOSIS — S72041D Displaced fracture of base of neck of right femur, subsequent encounter for closed fracture with routine healing: Secondary | ICD-10-CM | POA: Diagnosis not present

## 2023-05-02 DIAGNOSIS — I480 Paroxysmal atrial fibrillation: Secondary | ICD-10-CM | POA: Diagnosis not present

## 2023-05-02 DIAGNOSIS — Z7982 Long term (current) use of aspirin: Secondary | ICD-10-CM | POA: Diagnosis not present

## 2023-05-02 DIAGNOSIS — E785 Hyperlipidemia, unspecified: Secondary | ICD-10-CM | POA: Diagnosis not present

## 2023-05-02 DIAGNOSIS — Z8673 Personal history of transient ischemic attack (TIA), and cerebral infarction without residual deficits: Secondary | ICD-10-CM | POA: Diagnosis not present

## 2023-05-02 DIAGNOSIS — K219 Gastro-esophageal reflux disease without esophagitis: Secondary | ICD-10-CM | POA: Diagnosis not present

## 2023-05-08 DIAGNOSIS — Z96641 Presence of right artificial hip joint: Secondary | ICD-10-CM | POA: Diagnosis not present

## 2023-05-08 DIAGNOSIS — I480 Paroxysmal atrial fibrillation: Secondary | ICD-10-CM | POA: Diagnosis not present

## 2023-05-08 DIAGNOSIS — S72041D Displaced fracture of base of neck of right femur, subsequent encounter for closed fracture with routine healing: Secondary | ICD-10-CM | POA: Diagnosis not present

## 2023-05-08 DIAGNOSIS — Z8673 Personal history of transient ischemic attack (TIA), and cerebral infarction without residual deficits: Secondary | ICD-10-CM | POA: Diagnosis not present

## 2023-05-08 DIAGNOSIS — E785 Hyperlipidemia, unspecified: Secondary | ICD-10-CM | POA: Diagnosis not present

## 2023-05-08 DIAGNOSIS — I129 Hypertensive chronic kidney disease with stage 1 through stage 4 chronic kidney disease, or unspecified chronic kidney disease: Secondary | ICD-10-CM | POA: Diagnosis not present

## 2023-05-08 DIAGNOSIS — D62 Acute posthemorrhagic anemia: Secondary | ICD-10-CM | POA: Diagnosis not present

## 2023-05-08 DIAGNOSIS — K219 Gastro-esophageal reflux disease without esophagitis: Secondary | ICD-10-CM | POA: Diagnosis not present

## 2023-05-08 DIAGNOSIS — Z9181 History of falling: Secondary | ICD-10-CM | POA: Diagnosis not present

## 2023-05-08 DIAGNOSIS — N182 Chronic kidney disease, stage 2 (mild): Secondary | ICD-10-CM | POA: Diagnosis not present

## 2023-05-08 DIAGNOSIS — M1990 Unspecified osteoarthritis, unspecified site: Secondary | ICD-10-CM | POA: Diagnosis not present

## 2023-05-08 DIAGNOSIS — Z7982 Long term (current) use of aspirin: Secondary | ICD-10-CM | POA: Diagnosis not present

## 2023-05-08 DIAGNOSIS — W19XXXD Unspecified fall, subsequent encounter: Secondary | ICD-10-CM | POA: Diagnosis not present

## 2023-05-14 DIAGNOSIS — E785 Hyperlipidemia, unspecified: Secondary | ICD-10-CM | POA: Diagnosis not present

## 2023-05-14 DIAGNOSIS — M1990 Unspecified osteoarthritis, unspecified site: Secondary | ICD-10-CM | POA: Diagnosis not present

## 2023-05-14 DIAGNOSIS — Z8673 Personal history of transient ischemic attack (TIA), and cerebral infarction without residual deficits: Secondary | ICD-10-CM | POA: Diagnosis not present

## 2023-05-14 DIAGNOSIS — I480 Paroxysmal atrial fibrillation: Secondary | ICD-10-CM | POA: Diagnosis not present

## 2023-05-14 DIAGNOSIS — S72041D Displaced fracture of base of neck of right femur, subsequent encounter for closed fracture with routine healing: Secondary | ICD-10-CM | POA: Diagnosis not present

## 2023-05-14 DIAGNOSIS — I129 Hypertensive chronic kidney disease with stage 1 through stage 4 chronic kidney disease, or unspecified chronic kidney disease: Secondary | ICD-10-CM | POA: Diagnosis not present

## 2023-05-14 DIAGNOSIS — Z96641 Presence of right artificial hip joint: Secondary | ICD-10-CM | POA: Diagnosis not present

## 2023-05-14 DIAGNOSIS — W19XXXD Unspecified fall, subsequent encounter: Secondary | ICD-10-CM | POA: Diagnosis not present

## 2023-05-14 DIAGNOSIS — D62 Acute posthemorrhagic anemia: Secondary | ICD-10-CM | POA: Diagnosis not present

## 2023-05-14 DIAGNOSIS — Z9181 History of falling: Secondary | ICD-10-CM | POA: Diagnosis not present

## 2023-05-14 DIAGNOSIS — N182 Chronic kidney disease, stage 2 (mild): Secondary | ICD-10-CM | POA: Diagnosis not present

## 2023-05-14 DIAGNOSIS — Z7982 Long term (current) use of aspirin: Secondary | ICD-10-CM | POA: Diagnosis not present

## 2023-05-14 DIAGNOSIS — K219 Gastro-esophageal reflux disease without esophagitis: Secondary | ICD-10-CM | POA: Diagnosis not present

## 2023-05-21 DIAGNOSIS — W19XXXD Unspecified fall, subsequent encounter: Secondary | ICD-10-CM | POA: Diagnosis not present

## 2023-05-21 DIAGNOSIS — M1990 Unspecified osteoarthritis, unspecified site: Secondary | ICD-10-CM | POA: Diagnosis not present

## 2023-05-21 DIAGNOSIS — Z8673 Personal history of transient ischemic attack (TIA), and cerebral infarction without residual deficits: Secondary | ICD-10-CM | POA: Diagnosis not present

## 2023-05-21 DIAGNOSIS — N182 Chronic kidney disease, stage 2 (mild): Secondary | ICD-10-CM | POA: Diagnosis not present

## 2023-05-21 DIAGNOSIS — I129 Hypertensive chronic kidney disease with stage 1 through stage 4 chronic kidney disease, or unspecified chronic kidney disease: Secondary | ICD-10-CM | POA: Diagnosis not present

## 2023-05-21 DIAGNOSIS — I480 Paroxysmal atrial fibrillation: Secondary | ICD-10-CM | POA: Diagnosis not present

## 2023-05-21 DIAGNOSIS — Z9181 History of falling: Secondary | ICD-10-CM | POA: Diagnosis not present

## 2023-05-21 DIAGNOSIS — K219 Gastro-esophageal reflux disease without esophagitis: Secondary | ICD-10-CM | POA: Diagnosis not present

## 2023-05-21 DIAGNOSIS — S72041D Displaced fracture of base of neck of right femur, subsequent encounter for closed fracture with routine healing: Secondary | ICD-10-CM | POA: Diagnosis not present

## 2023-05-21 DIAGNOSIS — Z96641 Presence of right artificial hip joint: Secondary | ICD-10-CM | POA: Diagnosis not present

## 2023-05-21 DIAGNOSIS — D62 Acute posthemorrhagic anemia: Secondary | ICD-10-CM | POA: Diagnosis not present

## 2023-05-21 DIAGNOSIS — E785 Hyperlipidemia, unspecified: Secondary | ICD-10-CM | POA: Diagnosis not present

## 2023-05-21 DIAGNOSIS — Z7982 Long term (current) use of aspirin: Secondary | ICD-10-CM | POA: Diagnosis not present

## 2023-06-03 DIAGNOSIS — M25551 Pain in right hip: Secondary | ICD-10-CM | POA: Diagnosis not present

## 2023-06-05 DIAGNOSIS — Z7982 Long term (current) use of aspirin: Secondary | ICD-10-CM | POA: Diagnosis not present

## 2023-06-05 DIAGNOSIS — S72041D Displaced fracture of base of neck of right femur, subsequent encounter for closed fracture with routine healing: Secondary | ICD-10-CM | POA: Diagnosis not present

## 2023-06-05 DIAGNOSIS — W19XXXD Unspecified fall, subsequent encounter: Secondary | ICD-10-CM | POA: Diagnosis not present

## 2023-06-05 DIAGNOSIS — Z85828 Personal history of other malignant neoplasm of skin: Secondary | ICD-10-CM | POA: Diagnosis not present

## 2023-06-05 DIAGNOSIS — I129 Hypertensive chronic kidney disease with stage 1 through stage 4 chronic kidney disease, or unspecified chronic kidney disease: Secondary | ICD-10-CM | POA: Diagnosis not present

## 2023-06-05 DIAGNOSIS — I480 Paroxysmal atrial fibrillation: Secondary | ICD-10-CM | POA: Diagnosis not present

## 2023-06-05 DIAGNOSIS — E785 Hyperlipidemia, unspecified: Secondary | ICD-10-CM | POA: Diagnosis not present

## 2023-06-05 DIAGNOSIS — Z8673 Personal history of transient ischemic attack (TIA), and cerebral infarction without residual deficits: Secondary | ICD-10-CM | POA: Diagnosis not present

## 2023-06-05 DIAGNOSIS — Z86008 Personal history of in-situ neoplasm of other site: Secondary | ICD-10-CM | POA: Diagnosis not present

## 2023-06-05 DIAGNOSIS — N182 Chronic kidney disease, stage 2 (mild): Secondary | ICD-10-CM | POA: Diagnosis not present

## 2023-06-05 DIAGNOSIS — Z96641 Presence of right artificial hip joint: Secondary | ICD-10-CM | POA: Diagnosis not present

## 2023-06-05 DIAGNOSIS — K219 Gastro-esophageal reflux disease without esophagitis: Secondary | ICD-10-CM | POA: Diagnosis not present

## 2023-06-05 DIAGNOSIS — M1990 Unspecified osteoarthritis, unspecified site: Secondary | ICD-10-CM | POA: Diagnosis not present

## 2023-06-05 DIAGNOSIS — D62 Acute posthemorrhagic anemia: Secondary | ICD-10-CM | POA: Diagnosis not present

## 2023-06-05 DIAGNOSIS — D2371 Other benign neoplasm of skin of right lower limb, including hip: Secondary | ICD-10-CM | POA: Diagnosis not present

## 2023-06-05 DIAGNOSIS — C44729 Squamous cell carcinoma of skin of left lower limb, including hip: Secondary | ICD-10-CM | POA: Diagnosis not present

## 2023-06-05 DIAGNOSIS — Z9181 History of falling: Secondary | ICD-10-CM | POA: Diagnosis not present

## 2023-09-16 DIAGNOSIS — Z85828 Personal history of other malignant neoplasm of skin: Secondary | ICD-10-CM | POA: Diagnosis not present

## 2023-09-16 DIAGNOSIS — L578 Other skin changes due to chronic exposure to nonionizing radiation: Secondary | ICD-10-CM | POA: Diagnosis not present

## 2023-09-16 DIAGNOSIS — W908XXS Exposure to other nonionizing radiation, sequela: Secondary | ICD-10-CM | POA: Diagnosis not present

## 2023-09-16 DIAGNOSIS — L821 Other seborrheic keratosis: Secondary | ICD-10-CM | POA: Diagnosis not present

## 2023-09-19 DIAGNOSIS — Z8249 Family history of ischemic heart disease and other diseases of the circulatory system: Secondary | ICD-10-CM | POA: Diagnosis not present

## 2023-09-19 DIAGNOSIS — I129 Hypertensive chronic kidney disease with stage 1 through stage 4 chronic kidney disease, or unspecified chronic kidney disease: Secondary | ICD-10-CM | POA: Diagnosis not present

## 2023-09-19 DIAGNOSIS — D6869 Other thrombophilia: Secondary | ICD-10-CM | POA: Diagnosis not present

## 2023-09-19 DIAGNOSIS — Z823 Family history of stroke: Secondary | ICD-10-CM | POA: Diagnosis not present

## 2023-09-19 DIAGNOSIS — I4891 Unspecified atrial fibrillation: Secondary | ICD-10-CM | POA: Diagnosis not present

## 2023-09-19 DIAGNOSIS — K5900 Constipation, unspecified: Secondary | ICD-10-CM | POA: Diagnosis not present

## 2023-09-19 DIAGNOSIS — F325 Major depressive disorder, single episode, in full remission: Secondary | ICD-10-CM | POA: Diagnosis not present

## 2023-09-19 DIAGNOSIS — K219 Gastro-esophageal reflux disease without esophagitis: Secondary | ICD-10-CM | POA: Diagnosis not present

## 2023-09-19 DIAGNOSIS — E785 Hyperlipidemia, unspecified: Secondary | ICD-10-CM | POA: Diagnosis not present

## 2023-09-19 DIAGNOSIS — M1990 Unspecified osteoarthritis, unspecified site: Secondary | ICD-10-CM | POA: Diagnosis not present

## 2023-09-19 DIAGNOSIS — Z833 Family history of diabetes mellitus: Secondary | ICD-10-CM | POA: Diagnosis not present

## 2023-09-19 DIAGNOSIS — Z809 Family history of malignant neoplasm, unspecified: Secondary | ICD-10-CM | POA: Diagnosis not present

## 2024-01-13 DIAGNOSIS — D485 Neoplasm of uncertain behavior of skin: Secondary | ICD-10-CM | POA: Diagnosis not present

## 2024-01-13 DIAGNOSIS — L570 Actinic keratosis: Secondary | ICD-10-CM | POA: Diagnosis not present

## 2024-01-13 DIAGNOSIS — L578 Other skin changes due to chronic exposure to nonionizing radiation: Secondary | ICD-10-CM | POA: Diagnosis not present

## 2024-01-13 DIAGNOSIS — W908XXS Exposure to other nonionizing radiation, sequela: Secondary | ICD-10-CM | POA: Diagnosis not present

## 2024-03-04 DIAGNOSIS — E782 Mixed hyperlipidemia: Secondary | ICD-10-CM | POA: Diagnosis not present

## 2024-03-04 DIAGNOSIS — Z0000 Encounter for general adult medical examination without abnormal findings: Secondary | ICD-10-CM | POA: Diagnosis not present

## 2024-03-04 DIAGNOSIS — R7303 Prediabetes: Secondary | ICD-10-CM | POA: Diagnosis not present

## 2024-03-04 DIAGNOSIS — M8588 Other specified disorders of bone density and structure, other site: Secondary | ICD-10-CM | POA: Diagnosis not present

## 2024-03-04 DIAGNOSIS — N1831 Chronic kidney disease, stage 3a: Secondary | ICD-10-CM | POA: Diagnosis not present

## 2024-03-04 DIAGNOSIS — I10 Essential (primary) hypertension: Secondary | ICD-10-CM | POA: Diagnosis not present

## 2024-05-01 ENCOUNTER — Ambulatory Visit (INDEPENDENT_AMBULATORY_CARE_PROVIDER_SITE_OTHER)

## 2024-05-01 ENCOUNTER — Other Ambulatory Visit: Payer: Self-pay

## 2024-05-01 DIAGNOSIS — I10 Essential (primary) hypertension: Secondary | ICD-10-CM | POA: Diagnosis not present

## 2024-05-01 DIAGNOSIS — R2242 Localized swelling, mass and lump, left lower limb: Secondary | ICD-10-CM

## 2024-05-01 DIAGNOSIS — M7989 Other specified soft tissue disorders: Secondary | ICD-10-CM | POA: Diagnosis not present

## 2024-05-12 DIAGNOSIS — I10 Essential (primary) hypertension: Secondary | ICD-10-CM | POA: Diagnosis not present

## 2024-05-12 DIAGNOSIS — R7303 Prediabetes: Secondary | ICD-10-CM | POA: Diagnosis not present

## 2024-05-12 DIAGNOSIS — E782 Mixed hyperlipidemia: Secondary | ICD-10-CM | POA: Diagnosis not present

## 2024-05-12 DIAGNOSIS — G8929 Other chronic pain: Secondary | ICD-10-CM | POA: Diagnosis not present

## 2024-05-12 DIAGNOSIS — E559 Vitamin D deficiency, unspecified: Secondary | ICD-10-CM | POA: Diagnosis not present

## 2024-05-12 DIAGNOSIS — K219 Gastro-esophageal reflux disease without esophagitis: Secondary | ICD-10-CM | POA: Diagnosis not present

## 2024-05-12 DIAGNOSIS — R600 Localized edema: Secondary | ICD-10-CM | POA: Diagnosis not present

## 2024-05-12 DIAGNOSIS — N1831 Chronic kidney disease, stage 3a: Secondary | ICD-10-CM | POA: Diagnosis not present

## 2024-05-21 DIAGNOSIS — M7989 Other specified soft tissue disorders: Secondary | ICD-10-CM | POA: Diagnosis not present
